# Patient Record
Sex: Male | Born: 2000 | Race: Black or African American | Hispanic: No | Marital: Single | Smoking: Never smoker
Health system: Southern US, Community
[De-identification: ages and names within clinical notes are randomized; demographics above are authoritative.]

## PROBLEM LIST (undated history)

## (undated) DIAGNOSIS — R51 Headache: Secondary | ICD-10-CM

## (undated) DIAGNOSIS — I4891 Unspecified atrial fibrillation: Secondary | ICD-10-CM

## (undated) HISTORY — DX: Headache: R51

---
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
## 2021-01-20 ENCOUNTER — Other Ambulatory Visit: Payer: Medicaid Other

## 2021-01-20 ENCOUNTER — Other Ambulatory Visit: Payer: Self-pay

## 2021-01-20 DIAGNOSIS — Z20822 Contact with and (suspected) exposure to covid-19: Secondary | ICD-10-CM

## 2021-01-22 LAB — NOVEL CORONAVIRUS, NAA: SARS-CoV-2, NAA: NOT DETECTED

## 2021-01-22 LAB — SARS-COV-2, NAA 2 DAY TAT

## 2022-01-14 ENCOUNTER — Telehealth: Payer: Self-pay

## 2022-01-14 NOTE — Telephone Encounter (Signed)
Notes scanned to referral 

## 2022-01-27 ENCOUNTER — Ambulatory Visit (INDEPENDENT_AMBULATORY_CARE_PROVIDER_SITE_OTHER): Payer: Medicaid Other | Admitting: Cardiovascular Disease

## 2022-01-27 ENCOUNTER — Encounter: Payer: Self-pay | Admitting: Cardiovascular Disease

## 2022-01-27 ENCOUNTER — Other Ambulatory Visit: Payer: Self-pay

## 2022-01-27 VITALS — BP 122/84 | HR 67 | Ht 69.0 in | Wt 204.6 lb

## 2022-01-27 DIAGNOSIS — I480 Paroxysmal atrial fibrillation: Secondary | ICD-10-CM

## 2022-01-27 DIAGNOSIS — I422 Other hypertrophic cardiomyopathy: Secondary | ICD-10-CM

## 2022-01-27 DIAGNOSIS — Z01812 Encounter for preprocedural laboratory examination: Secondary | ICD-10-CM

## 2022-01-27 NOTE — Progress Notes (Signed)
Cardiology Office Note:    Date:  01/27/2022   ID:  James Moses, DOB [birth year], MRN [id number]  PCP:  Silvano Rusk, MD   [facility] Providers Cardiologist:  Thurmon Fair, MD     Referring MD: Dennison Mascot, MD   Chief Complaint  Patient presents with   Atrial Fibrillation    History of Present Illness:    James Moses is a 21 y.o. male with a hx of 2 episodes of paroxysmal atrial fibrillation and a questionable diagnosis of hypertrophic cardiomyopathy who has been previously followed in the [facility].  He is now 21 years old and is moving to an adult cardiology setting.  He had the initial episode of atrial fibrillation in September 2020 when he was 21 years old.  This happened while he was sitting down and laughing while at work at [employer].  In October 2020 he underwent an EP study that did not identify accessory pathways or AV node reentry or any other triggering arrhythmia.  In December 2020 had a recurrence episode of atrial fibrillation at rest, for which he required electrical cardioversion.  He has undergone a couple of cardiac MRI studies that have shown mild focal left ventricular thickening at the level of the inferoseptal and posterolateral walls to a thickness of about 13 mm (most recent MRI performed in January 2022, adenosine stress MRI).  He has not had any new episodes of atrial fibrillation since his last appointment and denies any palpitations.  He does not have chest pain or dyspnea at rest or with activity.  He has not had syncope however.  Denies lower extremity edema, focal neurological complaints.  There is no family history of hypertrophic cardiomyopathy or unexpected sudden death or unexplained arrhythmia.  His biological mother had lupus and end-stage renal disease and congestive heart failure.  His maternal grandmother died of cancer, there is little knowledge about the maternal grandfather's history.   The paternal grandfather is alive and healthy.  The maternal mother died of a probable drug overdose.  Past Medical History:  Diagnosis Date   Headache(784.0)     History reviewed. No pertinent surgical history.  Current Medications: Current Meds  Medication Sig   ibuprofen (ADVIL) 800 MG tablet Take 1 tablet (800 mg total) by mouth 3 (three) times daily.     Allergies:   Patient has no known allergies.   Social History   Socioeconomic History   Marital status: Single    Spouse name: Not on file   Number of children: Not on file   Years of education: Not on file   Highest education level: Not on file  Occupational History   Not on file  Tobacco Use   Smoking status: Never   Smokeless tobacco: Never  Vaping Use   Vaping Use: Never used  Substance and Sexual Activity   Alcohol use: Not on file   Drug use: Not Currently   Sexual activity: Not Currently  Other Topics Concern   Not on file  Social History Narrative   Not on file   Social Determinants of Health   Financial Resource Strain: Not on file  Food Insecurity: Not on file  Transportation Needs: Not on file  Physical Activity: Not on file  Stress: Not on file  Social Connections: Not on file     Family History: The patient's family history includes Cancer in his maternal grandmother; Headache in his father; Heart attack in his maternal  grandfather; Stroke in his paternal grandmother.  ROS:   Please see the history of present illness.    All other systems reviewed and are negative.  EKGs/Labs/Other Studies Reviewed:    The following studies were reviewed today: Notes, echocardiograms, MRI reports.  Echo 08/31/2019   1. Normal biventricular size and systolic function.   FINDINGS   Segmental Anatomy, Cardiac Position and Situs:  (Solitus D Ventricular Looping S). The heart position is within the left  hemithorax (levocardia). The cardiac apex is oriented leftward. The aorta  is to the right of the  pulmonary artery. The visceral situs was not well  visualized.   Systemic Veins:  A superior vena cava was not well visualized. The inferior vena cava is  right-sided and inserts into the right atrium normally.   Pulmonary Veins:  One right sided pulmonary vein seen to return normally to left atrium.   Atria:   Atrial septum not ideally visualized but no evidence of atrial level shunt  noted. The right atrium is normal in size. The left atrium is normal in  size.    Tricuspid Valve:  The tricuspid valve was normal. There is trivial (physiologic) tricuspid  valve regurgitation. There is no evidence of tricuspid valve stenosis. The  tricuspid regurgitant jet, as recorded, is inadequate for the purpose of  estimating right ventricular  systolic pressure.   Right Ventricle:  There is normal right ventricular size and qualitatively normal systolic  shortening.   Mitral Valve:  The mitral valve was normal. There is no evidence of mitral valve  stenosis. The papillary muscle configuration appears normal. There is no  mitral valve regurgitation.   Left Ventricle:   There is normal left ventricular size and qualitatively normal systolic  shortening. With improved filling, left ventricular wall measurements are  now within upper limits of normal for body size.   VSD:  There is no ventricular septal defect seen.   RVOT:  There is no right ventricular outflow tract obstruction.   Pulmonary Valve:  The pulmonary valve is structurally normal without stenosis. There is no  pulmonary valve stenosis. There is trivial pulmonary valve regurgitation.   Pulmonary Arteries:   The branch pulmonary arteries appear normal. The main pulmonary artery is  normal.   LVOT:  There is no left ventricular outflow tract obstruction.   Aortic Valve:  The aortic valve is normal. There is no aortic valve stenosis. There is no  aortic valve regurgitation.   Coronary Arteries:  The coronary  arteries were not well visualized.   Aorta:  The ascending aorta, transverse arch and descending aorta appear  unobstructed. Arch sidedness not well delineated on the study.   Pericardium:  There is no pericardial effusion.   Spectral Doppler and color Doppler were used to assess outflow tracts,  atrioventricular valves and semilunar valves.   LV M-mode:                Z-score LV Systolic Function:  IVS d:         1.11 cm    1.11    LV FS (M-mode):       35.9 %  IVS s:         1.18 cm    -0.65   LV EF (Cube):         74 %  LVID d:        3.79 cm    -4.26   LV EF (Teich):  66.2 %  LVID s:        2.43 cm    -3.02   LV SV:                40.8 ml  LVPW d:        1.18 cm    1.59    LV SI:                20.4 ml/m  LVPW s:        1.61 cm    0.06    LV Vol d:             61.6 ml  LV mass        177.1 g            LV Vol s:             20.8 ml  LV mass index: 86.77 g/m   EKG:  EKG is ordered today.  The ekg ordered today demonstrates sinus rhythm, T wave inversion in leads V5 V6, lead III and biphasic T wave in lead II and aVF.  Very similar to ECG from 11/26/2019  Recent Labs: No results found for requested labs within last 8760 hours.  Recent Lipid Panel No results found for: CHOL, TRIG, HDL, CHOLHDL, VLDL, LDLCALC, LDLDIRECT   Risk Assessment/Calculations:    CHA2DS2-VASc Score = 0   This indicates a 0.2% annual risk of stroke. The patient's score is based upon: CHF History: 0 HTN History: 0 Diabetes History: 0 Stroke History: 0 Vascular Disease History: 0 Age Score: 0 Gender Score: 0           Physical Exam:    VS:  BP 122/84    Pulse 67    Ht 5\' 9"  (1.753 m)    Wt 204 lb 9.6 oz (92.8 kg)    SpO2 98%    BMI 30.21 kg/m     Wt Readings from Last 3 Encounters:  01/27/22 204 lb 9.6 oz (92.8 kg) (94 %, Z= 1.55)*  11/26/19 191 lb 2.2 oz (86.7 kg) (93 %, Z= 1.48)*  08/30/19 184 lb 11.9 oz (83.8 kg) (91 %, Z= 1.37)*   * Growth percentiles are based on CDC (Boys,  2-20 Years) data.     GEN: Borderline obese, fit appearing, well nourished, well developed in no acute distress HEENT: Normal NECK: No JVD; No carotid bruits LYMPHATICS: No lymphadenopathy CARDIAC: RRR, no murmurs, rubs, gallops.  Unable to elicit a murmur with a Valsalva maneuver. RESPIRATORY:  Clear to auscultation without rales, wheezing or rhonchi  ABDOMEN: Soft, non-tender, non-distended MUSCULOSKELETAL:  No edema; No deformity  SKIN: Warm and dry NEUROLOGIC:  Alert and oriented x 3 PSYCHIATRIC:  Normal affect   ASSESSMENT:    1. Paroxysmal atrial fibrillation (HCC)   2. Hypertrophic cardiomyopathy ([place])   3. Pre-procedure lab exam    PLAN:    In order of problems listed above:  AFib: Infrequent events without clear trigger.  None in the last year.  Questionable underlying structural heart disease (hypertrophic cardiomyopathy).  Low embolic risk.  Not on anticoagulation or antiarrhythmic medications.  If another event occurs, would recommend a trial of flecainide "pill in pocket" approach.  He has a smart watch, but it is not capable of ECG monitoring.  Suggest upgrading to a generation for a higher apple watch or buying a cardia device for closer monitoring.  Avoid alcohol.  Okay to exercise, but prefer a aerobic rather than  anaerobic intense exercise. LV thickening: Questionable whether or not there is enough evidence to justify a diagnosis of hypertrophic cardiomyopathy.  As outlined in his pediatric cardiologist notes recommend a follow-up MRI.  Even if he has hypertrophic cardiomyopathy has no evidence of LVOT obstruction or any high risk features such as history of VT/syncope/family history of unexpected sudden death/excessive hypertrophy/severe scarring on LGE.           Medication Adjustments/Labs and Tests Ordered: Current medicines are reviewed at length with the patient today.  Concerns regarding medicines are outlined above.  Orders Placed This Encounter   Procedures   MR CARDIAC MORPHOLOGY W WO CONTRAST   Hemoglobin and hematocrit, blood   Basic Metabolic Panel (BMET)   EKG 12-Lead   No orders of the defined types were placed in this encounter.   Patient Instructions  Medication Instructions:  No changes *If you need a refill on your cardiac medications before your next appointment, please call your pharmacy*  Follow-Up: At [facility], you and your health needs are our priority.  As part of our continuing mission to provide you with exceptional heart care, we have created designated Provider Care Teams.  These Care Teams include your primary Cardiologist (physician) and Advanced Practice Providers (APPs -  Physician Assistants and Nurse Practitioners) who all work together to provide you with the care you need, when you need it.  We recommend signing up for the patient portal called "MyChart".  Sign up information is provided on this After Visit Summary.  MyChart is used to connect with patients for Virtual Visits (Telemedicine).  Patients are able to view lab/test results, encounter notes, upcoming appointments, etc.  Non-urgent messages can be sent to your provider as well.   To learn more about what you can do with MyChart, go to [email or web address].    Your next appointment:   12 month(s)  The format for your next appointment:   In Person  Provider:   Thurmon Fair, MD  Other Instructions   You are scheduled for Cardiac MRI on ______________. Please arrive at the [facility] main entrance of [facility] at ________________ (30-45 minutes prior to test start time). ?  [facility] [address] [place], [place] [address] [phone]  Please take advantage of the free valet parking available at the MAIN entrance (A entrance). Proceed to the [facility] Radiology Department (First Floor). ? Magnetic resonance imaging (MRI) is a painless test that produces images of the inside of the body  without using Xrays.  During an MRI, strong magnets and radio waves work together in a Data processing manager to form detailed images.   MRI images may provide more details about a medical condition than X-rays, CT scans, and ultrasounds can provide.  You may be given earphones to listen for instructions.  You may eat a light breakfast and take medications as ordered with the exception of HCTZ (fluid pill, other). Please avoid stimulants for 12 hr prior to test. (Ie. Caffeine, nicotine, chocolate, or antihistamine medications)  If a contrast material will be used, an IV will be inserted into one of your veins. Contrast material will be injected into your IV. It will leave your body through your urine within a day. You may be told to drink plenty of fluids to help flush the contrast material out of your system.  You will be asked to remove all metal, including: Watch, jewelry, and other metal objects including hearing aids, hair pieces and dentures. Also  wearable glucose monitoring systems (ie. Freestyle Libre and Omnipods) (Braces and fillings normally are not a problem.)   TEST WILL TAKE APPROXIMATELY 1 HOUR  PLEASE NOTIFY SCHEDULING AT LEAST 24 HOURS IN ADVANCE IF YOU ARE UNABLE TO KEEP YOUR APPOINTMENT. [phone]  Please call Marchia Bond, cardiac imaging nurse navigator with any questions/concerns. Marchia Bond RN Navigator Cardiac Imaging Gordy Clement RN Navigator Cardiac Imaging [facility] [phone] Office      Signed, Sanda Klein, MD  01/27/2022 6:25 PM    [place]

## 2022-01-27 NOTE — Patient Instructions (Addendum)
Medication Instructions:  No changes *If you need a refill on your cardiac medications before your next appointment, please call your pharmacy*  Follow-Up: At [facility], you and your health needs are our priority.  As part of our continuing mission to provide you with exceptional heart care, we have created designated Provider Care Teams.  These Care Teams include your primary Cardiologist (physician) and Advanced Practice Providers (APPs -  Physician Assistants and Nurse Practitioners) who all work together to provide you with the care you need, when you need it.  We recommend signing up for the patient portal called "MyChart".  Sign up information is provided on this After Visit Summary.  MyChart is used to connect with patients for Virtual Visits (Telemedicine).  Patients are able to view lab/test results, encounter notes, upcoming appointments, etc.  Non-urgent messages can be sent to your provider as well.   To learn more about what you can do with MyChart, go to [email or web address].    Your next appointment:   12 month(s)  The format for your next appointment:   In Person  Provider:   Thurmon Fair, MD  Other Instructions   You are scheduled for Cardiac MRI on ______________. Please arrive at the [facility] main entrance of  Medical Center at ________________ (30-45 minutes prior to test start time). ?  [facility] [address] [place], [place] [address] [phone]  Please take advantage of the free valet parking available at the MAIN entrance (A entrance). Proceed to the [facility] Radiology Department (First Floor). ? Magnetic resonance imaging (MRI) is a painless test that produces images of the inside of the body without using Xrays.  During an MRI, strong magnets and radio waves work together in a Data processing manager to form detailed images.   MRI images may provide more details about a medical condition than X-rays, CT scans, and ultrasounds  can provide.  You may be given earphones to listen for instructions.  You may eat a light breakfast and take medications as ordered with the exception of HCTZ (fluid pill, other). Please avoid stimulants for 12 hr prior to test. (Ie. Caffeine, nicotine, chocolate, or antihistamine medications)  If a contrast material will be used, an IV will be inserted into one of your veins. Contrast material will be injected into your IV. It will leave your body through your urine within a day. You may be told to drink plenty of fluids to help flush the contrast material out of your system.  You will be asked to remove all metal, including: Watch, jewelry, and other metal objects including hearing aids, hair pieces and dentures. Also wearable glucose monitoring systems (ie. Freestyle Libre and Omnipods) (Braces and fillings normally are not a problem.)   TEST WILL TAKE APPROXIMATELY 1 HOUR  PLEASE NOTIFY SCHEDULING AT LEAST 24 HOURS IN ADVANCE IF YOU ARE UNABLE TO KEEP YOUR APPOINTMENT. [phone]  Please call Rockwell Alexandria, cardiac imaging nurse navigator with any questions/concerns. Rockwell Alexandria RN Navigator Cardiac Imaging Larey Brick RN Navigator Cardiac Imaging [facility] [phone] Office

## 2022-01-28 LAB — HEMOGLOBIN AND HEMATOCRIT, BLOOD
Hematocrit: 46.9 % (ref 37.5–51.0)
Hemoglobin: 15.5 g/dL (ref 13.0–17.7)

## 2022-01-28 LAB — BASIC METABOLIC PANEL
BUN/Creatinine Ratio: 9 (ref 9–20)
BUN: 8 mg/dL (ref 6–20)
CO2: 26 mmol/L (ref 20–29)
Calcium: 9.9 mg/dL (ref 8.7–10.2)
Chloride: 99 mmol/L (ref 96–106)
Creatinine, Ser: 0.93 mg/dL (ref 0.76–1.27)
Glucose: 78 mg/dL (ref 70–99)
Potassium: 4.2 mmol/L (ref 3.5–5.2)
Sodium: 138 mmol/L (ref 134–144)
eGFR: 121 mL/min/{1.73_m2} (ref >59)

## 2022-02-01 ENCOUNTER — Encounter: Payer: Self-pay | Admitting: *Deleted

## 2022-02-26 ENCOUNTER — Telehealth (HOSPITAL_COMMUNITY): Payer: Self-pay | Admitting: Emergency Medicine

## 2022-02-26 NOTE — Telephone Encounter (Signed)
Reaching out to patient to offer assistance regarding upcoming cardiac imaging study; pt verbalizes understanding of appt date/time, parking situation and where to check in, pre-test NPO status and medications ordered, and verified current allergies; name and call back number provided for further questions should they arise ?Marchia Bond RN Navigator Cardiac Imaging ?[facility] ?[phone] office ?[phone] cell ? ?Denies implants ?Denies claustro ?Denies iv issues ?Arrival 1130 ?

## 2022-03-01 ENCOUNTER — Ambulatory Visit (HOSPITAL_COMMUNITY)
Admission: RE | Admit: 2022-03-01 | Discharge: 2022-03-01 | Disposition: A | Discharge status: Home / Self Care | Payer: Medicaid Other | Source: Ambulatory Visit | Attending: Cardiovascular Disease | Admitting: Cardiovascular Disease

## 2022-03-01 ENCOUNTER — Other Ambulatory Visit: Payer: Self-pay

## 2022-03-01 DIAGNOSIS — I422 Other hypertrophic cardiomyopathy: Secondary | ICD-10-CM | POA: Diagnosis present

## 2022-03-01 MED ORDER — GADOBUTROL 1 MMOL/ML IV SOLN
10.0000 mL | Freq: Once | INTRAVENOUS | Status: AC | PRN
  Administered 2022-03-01: 10 mL via INTRAVENOUS

## 2022-03-11 ENCOUNTER — Encounter: Payer: Self-pay | Admitting: *Deleted

## 2023-03-07 ENCOUNTER — Ambulatory Visit: Payer: Medicaid Other | Admitting: Cardiovascular Disease

## 2023-04-12 ENCOUNTER — Ambulatory Visit: Payer: Medicaid Other | Attending: Cardiovascular Disease | Admitting: Cardiovascular Disease

## 2023-04-12 ENCOUNTER — Encounter: Payer: Self-pay | Admitting: Cardiovascular Disease

## 2023-04-12 VITALS — BP 110/70 | HR 69 | Ht 70.0 in | Wt 223.8 lb

## 2023-04-12 DIAGNOSIS — I422 Other hypertrophic cardiomyopathy: Secondary | ICD-10-CM | POA: Diagnosis not present

## 2023-04-12 DIAGNOSIS — I480 Paroxysmal atrial fibrillation: Secondary | ICD-10-CM | POA: Diagnosis not present

## 2023-04-12 NOTE — Patient Instructions (Signed)
Medication Instructions:  No changes *If you need a refill on your cardiac medications before your next appointment, please call your pharmacy*  Testing/Procedures: Your physician has requested that you have an echocardiogram. Echocardiography is a painless test that uses sound waves to create images of your heart. It provides your doctor with information about the size and shape of your heart and how well your heart's chambers and valves are working. This procedure takes approximately one hour. There are no restrictions for this procedure. Please do NOT wear cologne, perfume, aftershave, or lotions (deodorant is allowed). Please arrive 15 minutes prior to your appointment time.    Follow-Up: At [facility], you and your health needs are our priority.  As part of our continuing mission to provide you with exceptional heart care, we have created designated Provider Care Teams.  These Care Teams include your primary Cardiologist (physician) and Advanced Practice Providers (APPs -  Physician Assistants and Nurse Practitioners) who all work together to provide you with the care you need, when you need it.  We recommend signing up for the patient portal called "MyChart".  Sign up information is provided on this After Visit Summary.  MyChart is used to connect with patients for Virtual Visits (Telemedicine).  Patients are able to view lab/test results, encounter notes, upcoming appointments, etc.  Non-urgent messages can be sent to your provider as well.   To learn more about what you can do with MyChart, go to [email or web address].    Your next appointment:   1 year(s)  Provider:   Mihai Croitoru, MD      

## 2023-04-12 NOTE — Progress Notes (Signed)
Cardiology Office Note:    Date:  04/12/2023   ID:  James Moses, DOB [birth year], MRN [id number]  PCP:  Pcp, No   [facility] Providers Cardiologist:  Thurmon Fair, MD     Referring MD: No ref. provider found   No chief complaint on file.   History of Present Illness:    James Moses is a 22 y.o. male with a hx of 2 episodes of paroxysmal atrial fibrillation and a questionable diagnosis of hypertrophic cardiomyopathy who has been previously followed in the [facility].    In the last 12 months he has not had any problems with palpitations, dizziness,  exertional dyspnea or angina.  Denies edema, orthopnea, PND, focal neurological events or claudication.  He has never experienced syncope.  He feels well.  He can exercise at the gym without any complaints.    He has had 2 previous episodes of atrial fibrillation in September 2020 and in December 2020 both of which occurred at rest.  The second event required electrical cardioversion.  He had an EP study in October 2020 that did not identify an accessory pathway, AVNRT or other triggering arrhythmia.  We repeated his cardiac MRI in March 2023.  This shows relatively mild LVH (posterior wall 1 12 mm, septum 13 mm) and does not show any evidence of gadolinium enhancement/scar.  He does not have LV outflow tract obstruction.  There is no family history of hypertrophic cardiomyopathy or unexpected sudden death or unexplained arrhythmia.  His biological mother had lupus and end-stage renal disease and congestive heart failure.  His maternal grandmother died of cancer, there is little knowledge about the maternal grandfather's history.  The paternal grandfather is alive and healthy.  The maternal grandmother died of a probable drug overdose.  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  Past Medical History:  Diagnosis Date   Headache(784.0)     No past surgical history on  file.  Current Medications: No outpatient medications have been marked as taking for the 04/12/23 encounter (Office Visit) with Thurmon Fair, MD.     Allergies:   Patient has no known allergies.   Social History   Socioeconomic History   Marital status: Single    Spouse name: Not on file   Number of children: Not on file   Years of education: Not on file   Highest education level: Not on file  Occupational History   Not on file  Tobacco Use   Smoking status: Never   Smokeless tobacco: Never  Vaping Use   Vaping Use: Never used  Substance and Sexual Activity   Alcohol use: Not on file   Drug use: Not Currently   Sexual activity: Not Currently  Other Topics Concern   Not on file  Social History Narrative   Not on file   Social Determinants of Health   Financial Resource Strain: Not on file  Food Insecurity: Not on file  Transportation Needs: Not on file  Physical Activity: Not on file  Stress: Not on file  Social Connections: Not on file     Family History: The patient's family history includes Cancer in his maternal grandmother; Headache in his father; Heart attack in his maternal grandfather; Stroke in his paternal grandmother.  ROS:   Please see the history of present illness.    All other systems reviewed and are negative.  EKGs/Labs/Other Studies Reviewed:    The following studies were  reviewed today: Cardiac MRI 03/01/2022  1. Mild LV hypertrophy measuring up to 13mm in septum (12mm in posterior wall), not meeting criteria for hypertrophic cardiomyopathy (<78mm)   2.  No late gadolinium enhancement to suggest myocardial scar   3.  Normal LV size and systolic function (EF 70%)   4.  Normal RV size and systolic function (EF 64%)  Echo 08/31/2019   1. Normal biventricular size and systolic function.   FINDINGS   Segmental Anatomy, Cardiac Position and Situs:  (Solitus D Ventricular Looping S). The heart position is within the left  hemithorax  (levocardia). The cardiac apex is oriented leftward. The aorta  is to the right of the pulmonary artery. The visceral situs was not well  visualized.   Systemic Veins:  A superior vena cava was not well visualized. The inferior vena cava is  right-sided and inserts into the right atrium normally.   Pulmonary Veins:  One right sided pulmonary vein seen to return normally to left atrium.   Atria:   Atrial septum not ideally visualized but no evidence of atrial level shunt  noted. The right atrium is normal in size. The left atrium is normal in  size.    Tricuspid Valve:  The tricuspid valve was normal. There is trivial (physiologic) tricuspid  valve regurgitation. There is no evidence of tricuspid valve stenosis. The  tricuspid regurgitant jet, as recorded, is inadequate for the purpose of  estimating right ventricular  systolic pressure.   Right Ventricle:  There is normal right ventricular size and qualitatively normal systolic  shortening.   Mitral Valve:  The mitral valve was normal. There is no evidence of mitral valve  stenosis. The papillary muscle configuration appears normal. There is no  mitral valve regurgitation.   Left Ventricle:   There is normal left ventricular size and qualitatively normal systolic  shortening. With improved filling, left ventricular wall measurements are  now within upper limits of normal for body size.   VSD:  There is no ventricular septal defect seen.   RVOT:  There is no right ventricular outflow tract obstruction.   Pulmonary Valve:  The pulmonary valve is structurally normal without stenosis. There is no  pulmonary valve stenosis. There is trivial pulmonary valve regurgitation.   Pulmonary Arteries:   The branch pulmonary arteries appear normal. The main pulmonary artery is  normal.   LVOT:  There is no left ventricular outflow tract obstruction.   Aortic Valve:  The aortic valve is normal. There is no aortic valve  stenosis. There is no  aortic valve regurgitation.   Coronary Arteries:  The coronary arteries were not well visualized.   Aorta:  The ascending aorta, transverse arch and descending aorta appear  unobstructed. Arch sidedness not well delineated on the study.   Pericardium:  There is no pericardial effusion.   Spectral Doppler and color Doppler were used to assess outflow tracts,  atrioventricular valves and semilunar valves.   LV M-mode:                Z-score LV Systolic Function:  IVS d:         1.11 cm    1.11    LV FS (M-mode):       35.9 %  IVS s:         1.18 cm    -0.65   LV EF (Cube):         74 %  LVID d:  3.79 cm    -4.26   LV EF (Teich):        66.2 %  LVID s:        2.43 cm    -3.02   LV SV:                40.8 ml  LVPW d:        1.18 cm    1.59    LV SI:                20.4 ml/m  LVPW s:        1.61 cm    0.06    LV Vol d:             61.6 ml  LV mass        177.1 g            LV Vol s:             20.8 ml  LV mass index: 86.77 g/m   EKG:  EKG is ordered today.  It shows normal sinus rhythm, T wave inversion leads V4-V5-V6 as well as lead III.  QTc 396 ms.  Very similar to previous tracings.  Recent Labs: No results found for requested labs within last 365 days.  Recent Lipid Panel No results found for: "CHOL", "TRIG", "HDL", "CHOLHDL", "VLDL", "LDLCALC", "LDLDIRECT"   Risk Assessment/Calculations:                 Physical Exam:    VS:  BP 110/70 (BP Location: Left Arm, Patient Position: Sitting, Cuff Size: Large)   Pulse 69   Ht 5\' 10"  (1.778 m)   Wt 223 lb 12.8 oz (101.5 kg)   SpO2 98%   BMI 32.11 kg/m     Wt Readings from Last 3 Encounters:  04/12/23 223 lb 12.8 oz (101.5 kg)  01/27/22 204 lb 9.6 oz (92.8 kg) (94 %, Z= 1.55)*  11/26/19 191 lb 2.2 oz (86.7 kg) (93 %, Z= 1.48)*   * Growth percentiles are based on CDC (Boys, 2-20 Years) data.      General: Alert, oriented x3, no distress, muscular, but also borderline obese Head: no  evidence of trauma, PERRL, EOMI, no exophtalmos or lid lag, no myxedema, no xanthelasma; normal ears, nose and oropharynx Neck: normal jugular venous pulsations and no hepatojugular reflux; brisk carotid pulses without delay and no carotid bruits Chest: clear to auscultation, no signs of consolidation by percussion or palpation, normal fremitus, symmetrical and full respiratory excursions Cardiovascular: normal position and quality of the apical impulse, regular rhythm, normal first and second heart sounds, no murmurs, rubs or gallops Abdomen: no tenderness or distention, no masses by palpation, no abnormal pulsatility or arterial bruits, normal bowel sounds, no hepatosplenomegaly Extremities: no clubbing, cyanosis or edema; 2+ radial, ulnar and brachial pulses bilaterally; 2+ right femoral, posterior tibial and dorsalis pedis pulses; 2+ left femoral, posterior tibial and dorsalis pedis pulses; no subclavian or femoral bruits Neurological: grossly nonfocal Psych: Normal mood and affect   ASSESSMENT:    1. Paroxysmal atrial fibrillation   2. Hypertrophic cardiomyopathy    PLAN:    In order of problems listed above:  AFib: Very infrequent events, none in the last 3 years, minimally symptomatic, without clear trigger but with underlying substrate of mild hypertrophic cardiomyopathy.  If another event occurs, would recommend a trial of flecainide "pill in pocket" approach.  Anticoagulation is not indicated at this time.  He  has a smart watch, but it is not capable of ECG monitoring.  Suggest upgrading to a generation for a higher apple watch or buying a cardia device for closer monitoring.  Avoid alcohol.  Okay to exercise, but prefer a aerobic rather than anaerobic intense exercise. LV thickening: There is mild left ventricular hypertrophy does not meet threshold to diagnose hypertrophic cardiomyopathy, but coupled with the history of atrial fibrillation and a mild ECG changes suggest that he does  have a mild cardiomyopathy.  Even if he has hypertrophic cardiomyopathy has no evidence of LVOT obstruction or any high risk features such as history of VT/syncope/family history of unexpected sudden death/excessive hypertrophy/severe scarring on LGE. Plan to monitor with echocardiograms every 3-5 years, cardiac MRI every 5-10 years, unless he develops new symptoms or ventricular arrhythmia.           Medication Adjustments/Labs and Tests Ordered: Current medicines are reviewed at length with the patient today.  Concerns regarding medicines are outlined above.  Orders Placed This Encounter  Procedures   EKG 12-Lead   ECHOCARDIOGRAM COMPLETE   No orders of the defined types were placed in this encounter.   Patient Instructions  Medication Instructions:  No changes *If you need a refill on your cardiac medications before your next appointment, please call your pharmacy*  Testing/Procedures: Your physician has requested that you have an echocardiogram. Echocardiography is a painless test that uses sound waves to create images of your heart. It provides your doctor with information about the size and shape of your heart and how well your heart's chambers and valves are working. This procedure takes approximately one hour. There are no restrictions for this procedure. Please do NOT wear cologne, perfume, aftershave, or lotions (deodorant is allowed). Please arrive 15 minutes prior to your appointment time.    Follow-Up: At [facility], you and your health needs are our priority.  As part of our continuing mission to provide you with exceptional heart care, we have created designated Provider Care Teams.  These Care Teams include your primary Cardiologist (physician) and Advanced Practice Providers (APPs -  Physician Assistants and Nurse Practitioners) who all work together to provide you with the care you need, when you need it.  We recommend signing up for the patient portal  called "MyChart".  Sign up information is provided on this After Visit Summary.  MyChart is used to connect with patients for Virtual Visits (Telemedicine).  Patients are able to view lab/test results, encounter notes, upcoming appointments, etc.  Non-urgent messages can be sent to your provider as well.   To learn more about what you can do with MyChart, go to [email or web address].    Your next appointment:   1 year(s)  Provider:   Thurmon Fair, MD        Signed, Thurmon Fair, MD  04/12/2023 12:29 PM     [facility]

## 2023-05-16 ENCOUNTER — Ambulatory Visit (HOSPITAL_COMMUNITY): Payer: Medicaid Other | Attending: Cardiovascular Disease

## 2023-05-16 DIAGNOSIS — I422 Other hypertrophic cardiomyopathy: Secondary | ICD-10-CM | POA: Insufficient documentation

## 2023-05-16 LAB — ECHOCARDIOGRAM COMPLETE
Area-P 1/2: 3.45 cm2
S' Lateral: 2.3 cm

## 2023-05-17 IMAGING — MR MR CARD MORPHOLOGY WO/W CM
45 of 48 series · 45 of 48 positions shown · IV contrast (Contrast agent)
Comparison: none

CLINICAL DATA: HCM evaluation

EXAM:
CARDIAC MRI
TECHNIQUE: The patient was scanned on a 1.5 Tesla Siemens magnet. A dedicated
cardiac coil was used. Functional imaging was done using Fiesta
sequences. [DATE], and 4 chamber views were done to assess for RWMA's.
Modified Kareem rule using a short axis stack was used to
calculate an ejection fraction on a dedicated work station using
Circle software. The patient received 10 cc of Gadavist. After 10
minutes inversion recovery sequences were used to assess for
infiltration and scar tissue.
CONTRAST:  10 cc  of Gadavist

[Series 4: [id number] · axial · 8.0mm · 1.41mm/px · 1 of 16 slices shown]
[im 1/16]
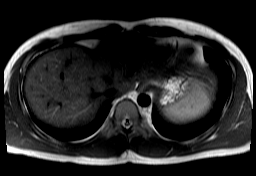

[Series 8: bSSFP · oblique · 8.0mm · 1.61mm/px · 1 of 25 slices shown (1 of 22)]
[im 1/25]
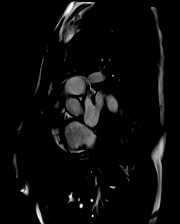

[Series 9: bSSFP · oblique · 8.0mm · 1.61mm/px · 1 of 25 slices shown (2 of 22)]
[im 1/25]
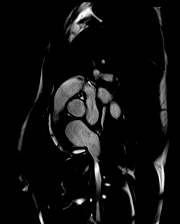

[Series 10: bSSFP · oblique · 8.0mm · 1.61mm/px · 1 of 25 slices shown (3 of 22)]
[im 1/25]
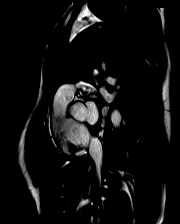

[Series 11: bSSFP · oblique · 8.0mm · 1.61mm/px · 1 of 25 slices shown (4 of 22)]
[im 1/25]
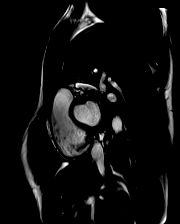

[Series 12: bSSFP · oblique · 8.0mm · 1.61mm/px · 1 of 25 slices shown (5 of 22)]
[im 1/25]
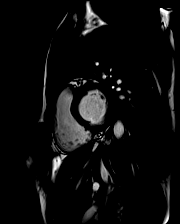

[Series 13: bSSFP · oblique · 8.0mm · 1.61mm/px · 1 of 25 slices shown (6 of 22)]
[im 1/25]
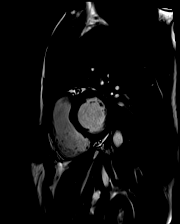

[Series 14: bSSFP · oblique · 8.0mm · 1.61mm/px · 1 of 25 slices shown (7 of 22)]
[im 1/25]
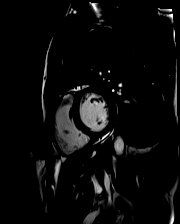

[Series 15: bSSFP · oblique · 8.0mm · 1.61mm/px · 1 of 25 slices shown (8 of 22)]
[im 1/25]
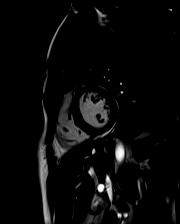

[Series 16: bSSFP · oblique · 8.0mm · 1.61mm/px · 1 of 25 slices shown (9 of 22)]
[im 1/25]
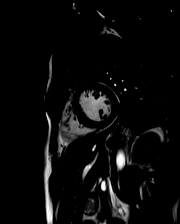

[Series 17: bSSFP · oblique · 8.0mm · 1.61mm/px · 1 of 25 slices shown (10 of 22)]
[im 1/25]
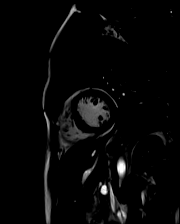

[Series 18: bSSFP · oblique · 8.0mm · 1.61mm/px · 1 of 25 slices shown (11 of 22)]
[im 1/25]
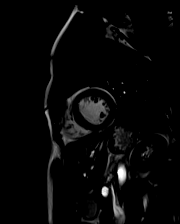

[Series 19: bSSFP · oblique · 8.0mm · 1.61mm/px · 1 of 25 slices shown (12 of 22)]
[im 1/25]
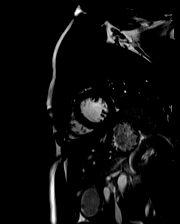

[Series 20: bSSFP · oblique · 8.0mm · 1.61mm/px · 1 of 25 slices shown (13 of 22)]
[im 1/25]
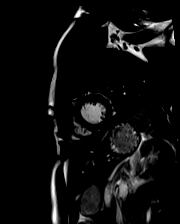

[Series 21: bSSFP · oblique · 8.0mm · 1.61mm/px · 1 of 25 slices shown (14 of 22)]
[im 1/25]
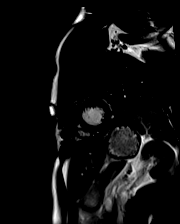

[Series 22: bSSFP · oblique · 8.0mm · 1.61mm/px · 1 of 25 slices shown (15 of 22)]
[im 1/25]
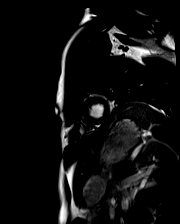

[Series 23: bSSFP · oblique · 8.0mm · 1.61mm/px · 1 of 25 slices shown (16 of 22)]
[im 1/25]
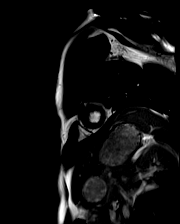

[Series 24: bSSFP · oblique · 8.0mm · 1.61mm/px · 1 of 25 slices shown (17 of 22)]
[im 1/25]
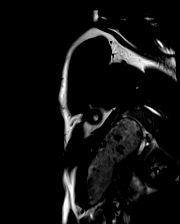

[Series 25: bSSFP · oblique · 8.0mm · 1.61mm/px · 1 of 25 slices shown (18 of 22)]
[im 1/25]
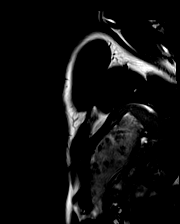

[Series 26: bSSFP · oblique · 8.0mm · 1.61mm/px · 1 of 25 slices shown (19 of 22)]
[im 1/25]
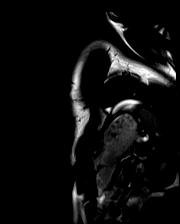

[Series 27: bSSFP · oblique · 6.0mm · 1.41mm/px · 1 of 25 slices shown (20 of 22)]
[im 1/25]
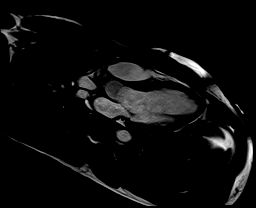

[Series 28: bSSFP · oblique · 6.0mm · 1.41mm/px · 1 of 25 slices shown (21 of 22)]
[im 1/25]
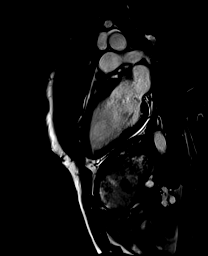

[Series 29: bSSFP · oblique · 6.0mm · 1.41mm/px · 1 of 25 slices shown (22 of 22)]
[im 1/25]
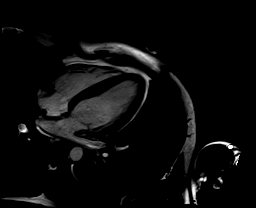

[Series 30: cine_trufi_short axis_cs_2_shot · oblique · 8.0mm · 1.48mm/px · 1 of 25 slices shown (1 of 22)]
[im 1/25]
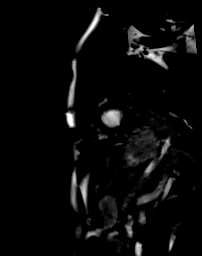

[Series 30: cine_trufi_short axis_cs_2_shot · oblique · 8.0mm · 1.48mm/px · 1 of 25 slices shown (2 of 22)]
[im 1/25]
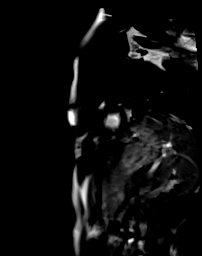

[Series 30: cine_trufi_short axis_cs_2_shot · oblique · 8.0mm · 1.48mm/px · 1 of 25 slices shown (3 of 22)]
[im 1/25]
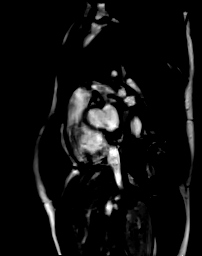

[Series 30: cine_trufi_short axis_cs_2_shot · oblique · 8.0mm · 1.48mm/px · 1 of 25 slices shown (4 of 22)]
[im 1/25]
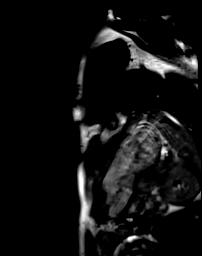

[Series 30: cine_trufi_short axis_cs_2_shot · oblique · 8.0mm · 1.48mm/px · 1 of 25 slices shown (5 of 22)]
[im 1/25]
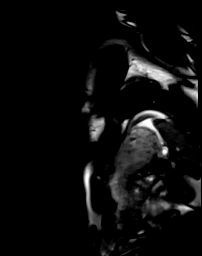

[Series 30: cine_trufi_short axis_cs_2_shot · oblique · 8.0mm · 1.48mm/px · 1 of 25 slices shown (6 of 22)]
[im 1/25]
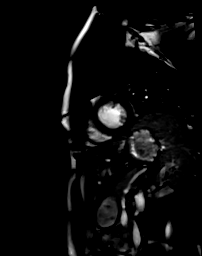

[Series 30: cine_trufi_short axis_cs_2_shot · oblique · 8.0mm · 1.48mm/px · 1 of 25 slices shown (7 of 22)]
[im 1/25]
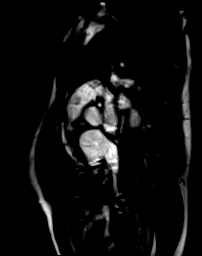

[Series 30: cine_trufi_short axis_cs_2_shot · oblique · 8.0mm · 1.48mm/px · 1 of 25 slices shown (8 of 22)]
[im 1/25]
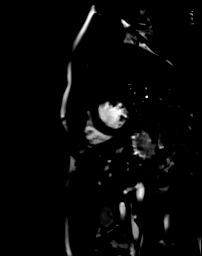

[Series 30: cine_trufi_short axis_cs_2_shot · oblique · 8.0mm · 1.48mm/px · 1 of 25 slices shown (9 of 22)]
[im 1/25]
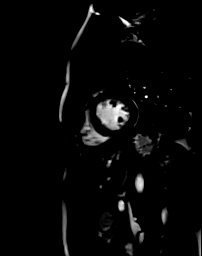

[Series 30: cine_trufi_short axis_cs_2_shot · oblique · 8.0mm · 1.48mm/px · 1 of 25 slices shown (10 of 22)]
[im 1/25]
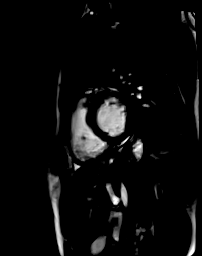

[Series 30: cine_trufi_short axis_cs_2_shot · oblique · 8.0mm · 1.48mm/px · 1 of 25 slices shown (11 of 22)]
[im 1/25]
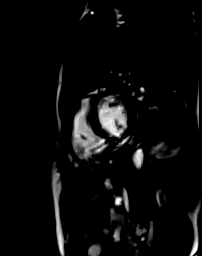

[Series 30: cine_trufi_short axis_cs_2_shot · oblique · 8.0mm · 1.48mm/px · 1 of 25 slices shown (12 of 22)]
[im 1/25]
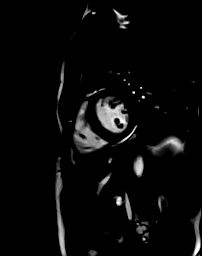

[Series 30: cine_trufi_short axis_cs_2_shot · oblique · 8.0mm · 1.48mm/px · 1 of 25 slices shown (13 of 22)]
[im 1/25]
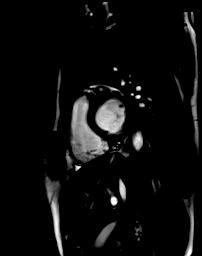

[Series 30: cine_trufi_short axis_cs_2_shot · oblique · 8.0mm · 1.48mm/px · 1 of 25 slices shown (14 of 22)]
[im 1/25]
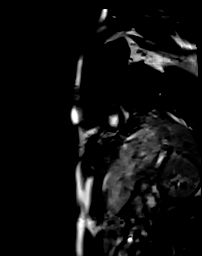

[Series 30: cine_trufi_short axis_cs_2_shot · oblique · 8.0mm · 1.48mm/px · 1 of 25 slices shown (15 of 22)]
[im 1/25]
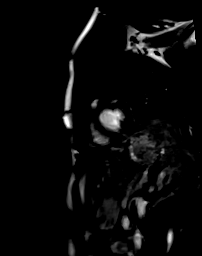

[Series 30: cine_trufi_short axis_cs_2_shot · oblique · 8.0mm · 1.48mm/px · 1 of 25 slices shown (16 of 22)]
[im 1/25]
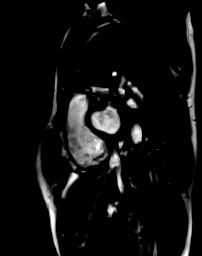

[Series 30: cine_trufi_short axis_cs_2_shot · oblique · 8.0mm · 1.48mm/px · 1 of 25 slices shown (17 of 22)]
[im 1/25]
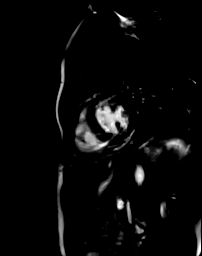

[Series 30: cine_trufi_short axis_cs_2_shot · oblique · 8.0mm · 1.48mm/px · 1 of 25 slices shown (18 of 22)]
[im 1/25]
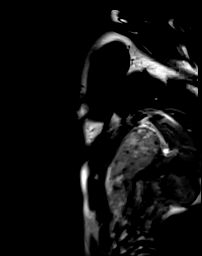

[Series 31: cine_trufi_short axis_cs_2_shot · oblique · 8.0mm · 1.48mm/px · 1 of 25 slices shown (19 of 22)]
[im 1/25]
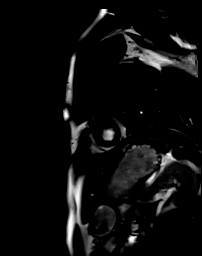

[Series 31: cine_trufi_short axis_cs_2_shot · oblique · 8.0mm · 1.48mm/px · 1 of 25 slices shown (20 of 22)]
[im 1/25]
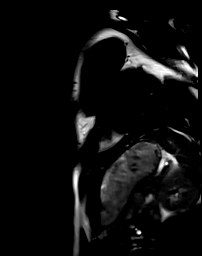

[Series 31: cine_trufi_short axis_cs_2_shot · oblique · 8.0mm · 1.48mm/px · 1 of 25 slices shown (21 of 22)]
[im 1/25]
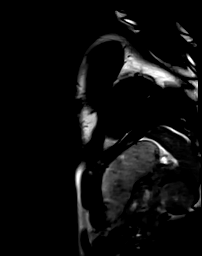

[Series 31: cine_trufi_short axis_cs_2_shot · oblique · 8.0mm · 1.48mm/px · 1 of 25 slices shown (22 of 22)]
[im 1/25]
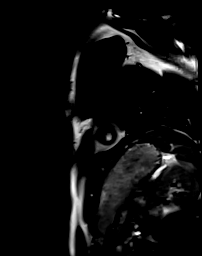

[45 of 48 positions shown; findings below may reference images not displayed]

FINDINGS: Left ventricle:

-Mild hypertrophy measuring up to 13mm in septum (12mm in posterior
wall), not meeting criteria for hypertrophic cardiomyopathy (<15mm)

-Normal size

-Normal systolic function

-Normal ECV (24%)

-No LGE

LV EF: 70% (Normal 56-78%)

Absolute volumes:

LV EDV: 171mL (Normal 77-195 mL)

LV ESV: 52mL (Normal 19-72 mL)

LV SV: 119mL (Normal 51-133 mL)

CO: 7.8L/min (Normal 2.8-8.8 L/min)

Indexed volumes:

LV EDV: 80mL/sq-m (Normal 47-92 mL/sq-m)

LV ESV: 24mL/sq-m (Normal 13-30 mL/sq-m)

LV SV: 56mL/sq-m (Normal 32-62 mL/sq-m)

CI: 3.7L/min/sq-m (Normal 1.7-4.2 L/min/sq-m)

Right ventricle: Normal size and systolic function

RV EF:  64% (Normal 47-74%)

Absolute volumes:

RV EDV: 193mL (Normal 88-227 mL)

RV ESV: 70mL (Normal 23-103 mL)

RV SV: 123mL (Normal 52-138 mL)

CO: 8.1L/min (Normal 2.8-8.8 L/min)

Indexed volumes:

RV EDV: 91mL/sq-m (Normal 55-105 mL/sq-m)

RV ESV: 33mL/sq-m (Normal 15-43 mL/sq-m)

RV SV: 58mL/sq-m (Normal 32-64 mL/sq-m)

CI: 3.8L/min/sq-m (Normal 1.7-4.2 L/min/sq-m)

Left atrium: Normal size

Right atrium: Normal size

Mitral valve: No regurgitation

Aortic valve: No regurgitation

Tricuspid valve: Trivial regurgitation

Pulmonic valve: No regurgitation

Aorta: Normal proximal ascending aorta

Pericardium: Small effusion
IMPRESSION: 1. Mild LV hypertrophy measuring up to 13mm in septum (12mm in
posterior wall), not meeting criteria for hypertrophic
cardiomyopathy (<15mm)

2.  No late gadolinium enhancement to suggest myocardial scar

3.  Normal LV size and systolic function (EF 70%)

4.  Normal RV size and systolic function (EF 64%)

## 2023-08-03 ENCOUNTER — Encounter: Payer: Self-pay | Admitting: Podiatry

## 2023-08-03 ENCOUNTER — Ambulatory Visit (INDEPENDENT_AMBULATORY_CARE_PROVIDER_SITE_OTHER): Payer: Medicaid Other | Admitting: Podiatry

## 2023-08-03 DIAGNOSIS — B351 Tinea unguium: Secondary | ICD-10-CM | POA: Diagnosis not present

## 2023-08-03 DIAGNOSIS — G43909 Migraine, unspecified, not intractable, without status migrainosus: Secondary | ICD-10-CM | POA: Insufficient documentation

## 2023-08-03 DIAGNOSIS — Z79899 Other long term (current) drug therapy: Secondary | ICD-10-CM | POA: Diagnosis not present

## 2023-08-03 NOTE — Progress Notes (Signed)
  Subjective:  Patient ID: James Moses, male    DOB: 31-Dec-2000,  MRN: [id number]  Chief Complaint  Patient presents with   Nail Problem    Nail discoloration     22 y.o. male presents with the above complaint.  Patient presents for bilateral hallux third fifth times a day taken Condition for mycotic nail.  He would like to discuss treatment options for.  Has tried some topical which has not helped.  He denies any other acute complaints.  He has not tried an oral medication   Review of Systems: Negative except as noted in the HPI. Denies N/V/F/Ch.  Past Medical History:  Diagnosis Date   Headache(784.0)     Current Outpatient Medications:    ibuprofen (ADVIL) 800 MG tablet, Take 1 tablet (800 mg total) by mouth 3 (three) times daily. (Patient not taking: Reported on 04/12/2023), Disp: 21 tablet, Rfl: 0  Social History   Tobacco Use  Smoking Status Never  Smokeless Tobacco Never    No Known Allergies Objective:  There were no vitals filed for this visit. There is no height or weight on file to calculate BMI. Constitutional Well developed. Well nourished.  Vascular Dorsalis pedis pulses palpable bilaterally. Posterior tibial pulses palpable bilaterally. Capillary refill normal to all digits.  No cyanosis or clubbing noted. Pedal hair growth normal.  Neurologic Normal speech. Oriented to person, place, and time. Epicritic sensation to light touch grossly present bilaterally.  Dermatologic Nails thickened and dystrophic mycotic toenails x 8 to bilateral hallux third and fifth digit Skin within normal limits  Orthopedic: Normal joint ROM without pain or crepitus bilaterally. No visible deformities. No bony tenderness.   Radiographs: None Assessment:   1. Onychomycosis   2. Long-term use of high-risk medication   3. Nail fungus    Plan:  Patient was evaluated and treated and all questions answered.  Bilateral hallux started fifth digit onychomycosis -Educated  the patient on the etiology of onychomycosis and various treatment options associated with improving the fungal load.  I explained to the patient that there is 3 treatment options available to treat the onychomycosis including topical, p.o., laser treatment.  Patient elected to undergo p.o. options with Lamisil/terbinafine therapy.  In order for me to start the medication therapy, I explained to the patient the importance of evaluating the liver and obtaining the liver function test.  Once the liver function test comes back normal I will start him on 15-month course of Lamisil therapy.  Patient understood all risk and would like to proceed with Lamisil therapy.  I have asked the patient to immediately stop the Lamisil therapy if she has any reactions to it and call the office or go to the emergency room right away.  Patient states understanding   No follow-ups on file.

## 2023-08-16 ENCOUNTER — Other Ambulatory Visit: Payer: Self-pay | Admitting: Podiatry

## 2023-08-17 LAB — HEPATIC FUNCTION PANEL
ALT: 24 IU/L (ref 0–44)
AST: 16 IU/L (ref 0–40)
Albumin: 4.6 g/dL (ref 4.3–5.2)
Alkaline Phosphatase: 92 IU/L (ref 44–121)
Bilirubin Total: 0.4 mg/dL (ref 0.0–1.2)
Bilirubin, Direct: <0.1 mg/dL (ref 0.00–0.40)
Total Protein: 7.3 g/dL (ref 6.0–8.5)

## 2023-08-23 MED ORDER — TERBINAFINE HCL 250 MG PO TABS: 250.0000 mg | ORAL_TABLET | Freq: Every day | ORAL | 0 refills | Status: DC

## 2023-08-23 NOTE — Addendum Note (Signed)
Addended by: Nicholes Rough on: 08/23/2023 07:58 AM   Modules accepted: Orders

## 2023-08-26 ENCOUNTER — Other Ambulatory Visit: Payer: Self-pay

## 2023-08-26 ENCOUNTER — Emergency Department (HOSPITAL_COMMUNITY)
Admission: EM | Admit: 2023-08-26 | Discharge: 2023-08-26 | Disposition: A | Discharge status: Home / Self Care | Payer: Medicaid Other | Attending: Emergency Medicine | Admitting: Emergency Medicine

## 2023-08-26 ENCOUNTER — Emergency Department (HOSPITAL_COMMUNITY): Payer: Medicaid Other

## 2023-08-26 DIAGNOSIS — R002 Palpitations: Secondary | ICD-10-CM | POA: Diagnosis present

## 2023-08-26 DIAGNOSIS — I4891 Unspecified atrial fibrillation: Secondary | ICD-10-CM | POA: Diagnosis not present

## 2023-08-26 LAB — CBC WITH DIFFERENTIAL/PLATELET
Abs Immature Granulocytes: 0.01 10*3/uL (ref 0.00–0.07)
Basophils Absolute: 0 10*3/uL (ref 0.0–0.1)
Basophils Relative: 1 %
Eosinophils Absolute: 0.1 10*3/uL (ref 0.0–0.5)
Eosinophils Relative: 2 %
HCT: 48.8 % (ref 39.0–52.0)
Hemoglobin: 15.8 g/dL (ref 13.0–17.0)
Immature Granulocytes: 0 %
Lymphocytes Relative: 37 %
Lymphs Abs: 1.8 10*3/uL (ref 0.7–4.0)
MCH: 24.2 pg — ABNORMAL LOW (ref 26.0–34.0)
MCHC: 32.4 g/dL (ref 30.0–36.0)
MCV: 74.6 fL — ABNORMAL LOW (ref 80.0–100.0)
Monocytes Absolute: 0.5 10*3/uL (ref 0.1–1.0)
Monocytes Relative: 10 %
Neutro Abs: 2.4 10*3/uL (ref 1.7–7.7)
Neutrophils Relative %: 50 %
Platelets: 340 10*3/uL (ref 150–400)
RBC: 6.54 MIL/uL — ABNORMAL HIGH (ref 4.22–5.81)
RDW: 16.3 % — ABNORMAL HIGH (ref 11.5–15.5)
WBC: 4.8 10*3/uL (ref 4.0–10.5)
nRBC: 0 % (ref 0.0–0.2)

## 2023-08-26 LAB — BASIC METABOLIC PANEL
Anion gap: 10 (ref 5–15)
BUN: 6 mg/dL (ref 6–20)
CO2: 25 mmol/L (ref 22–32)
Calcium: 9.5 mg/dL (ref 8.9–10.3)
Chloride: 102 mmol/L (ref 98–111)
Creatinine, Ser: 1.02 mg/dL (ref 0.61–1.24)
GFR, Estimated: >60 mL/min (ref >60)
Glucose, Bld: 96 mg/dL (ref 70–99)
Potassium: 3.8 mmol/L (ref 3.5–5.1)
Sodium: 137 mmol/L (ref 135–145)

## 2023-08-26 LAB — TROPONIN I (HIGH SENSITIVITY)
Troponin I (High Sensitivity): 8 ng/L (ref <18)
Troponin I (High Sensitivity): 10 ng/L (ref <18)

## 2023-08-26 LAB — TSH: TSH: 0.942 u[IU]/mL (ref 0.350–4.500)

## 2023-08-26 LAB — MAGNESIUM: Magnesium: 1.8 mg/dL (ref 1.7–2.4)

## 2023-08-26 MED ORDER — ONDANSETRON HCL 4 MG/2ML IJ SOLN
4.0000 mg | Freq: Once | INTRAMUSCULAR | Status: AC
  Administered 2023-08-26: 4 mg via INTRAVENOUS
  Filled 2023-08-26: qty 2

## 2023-08-26 MED ORDER — FENTANYL CITRATE PF 50 MCG/ML IJ SOSY
50.0000 ug | PREFILLED_SYRINGE | Freq: Once | INTRAMUSCULAR | Status: AC
  Administered 2023-08-26: 50 ug via INTRAVENOUS
  Filled 2023-08-26: qty 1

## 2023-08-26 MED ORDER — METOPROLOL TARTRATE 5 MG/5ML IV SOLN
5.0000 mg | Freq: Once | INTRAVENOUS | Status: AC
  Administered 2023-08-26: 5 mg via INTRAVENOUS
  Filled 2023-08-26: qty 5

## 2023-08-26 MED ORDER — ETOMIDATE 2 MG/ML IV SOLN
10.0000 mg | Freq: Once | INTRAVENOUS | Status: AC
  Administered 2023-08-26: 10 mg via INTRAVENOUS
  Filled 2023-08-26: qty 10

## 2023-08-26 MED ORDER — SODIUM CHLORIDE 0.9 % IV BOLUS
1000.0000 mL | Freq: Once | INTRAVENOUS | Status: AC
  Administered 2023-08-26: 1000 mL via INTRAVENOUS

## 2023-08-26 NOTE — ED Notes (Signed)
..  The patient is A&OX4, ambulatory at d/c with independent steady gait, NAD. Pt verbalized understanding of d/c instructions and follow up care.  

## 2023-08-26 NOTE — ED Provider Notes (Signed)
[place] EMERGENCY DEPARTMENT AT [facility] Provider Note  CSN: [id number] Arrival date & time: 08/26/23 1215  Chief Complaint(s) Tachycardia  HPI James Moses is a 22 y.o. male with past medical history as below, significant for Dr Royann Shivers pAF, hypertrophic cardiomyopathy who presents to the ED with complaint of afib.   Patient reports that he was awakened suddenly this morning after a nightmare, was having palpitations, felt he was in A-fib.  Heart rate elevated.  Has been having fatigue since the onset of symptoms, mild chest tightness.  Normal state of health when he went to bed last night.  no recent licit drug or excessive stimulant use.  No dyspnea, abdominal pain nausea or vomiting  Past Medical History Past Medical History:  Diagnosis Date   Headache(784.0)    Patient Active Problem List   Diagnosis Date Noted   Migraines 08/03/2023   Need for vaccination 10/03/2020   Well child check 10/03/2020   Atrial fibrillation (HCC) 08/30/2019   Chest pain 09/03/2013   Palpitations 09/03/2013   Migraine without aura, without mention of intractable migraine without mention of status migrainosus 08/29/2013   Tension type headache, unspecified 08/29/2013   Home Medication(s) Prior to Admission medications   Medication Sig Start Date End Date Taking? Authorizing Provider  ibuprofen (ADVIL) 800 MG tablet Take 1 tablet (800 mg total) by mouth 3 (three) times daily. Patient not taking: Reported on 04/12/2023 09/09/20   Wieters, Hallie C, PA-C  terbinafine (LAMISIL) 250 MG tablet Take 1 tablet (250 mg total) by mouth daily. Patient not taking: Reported on 08/26/2023 08/23/23   Candelaria Stagers, DPM                                                                                                                                    Past Surgical History No past surgical history on file. Family History Family History  Problem Relation Age of Onset   Headache Father    Cancer  Maternal Grandmother    Heart attack Maternal Grandfather    Stroke Paternal Grandmother        Died at 87    Social History Social History   Tobacco Use   Smoking status: Never   Smokeless tobacco: Never  Vaping Use   Vaping status: Never Used  Substance Use Topics   Drug use: Not Currently   Allergies Patient has no known allergies.  Review of Systems Review of Systems  Constitutional:  Positive for fatigue.  Respiratory:  Positive for chest tightness.   Cardiovascular:  Positive for palpitations.  All other systems reviewed and are negative.   Physical Exam Vital Signs  I have reviewed the triage vital signs BP 109/70   Pulse 79   Temp 97.8 F (36.6 C) (Oral)   Resp (!) 22   SpO2 99%  Physical Exam Vitals and nursing note reviewed.  Constitutional:      General:  He is not in acute distress.    Appearance: Normal appearance. He is well-developed.  HENT:     Head: Normocephalic and atraumatic.     Right Ear: External ear normal.     Left Ear: External ear normal.     Mouth/Throat:     Mouth: Mucous membranes are moist.  Eyes:     General: No scleral icterus. Cardiovascular:     Rate and Rhythm: Tachycardia present. Rhythm irregular.     Pulses: Normal pulses.     Heart sounds: Normal heart sounds.  Pulmonary:     Effort: Pulmonary effort is normal. No respiratory distress.     Breath sounds: Normal breath sounds.  Abdominal:     General: Abdomen is flat.     Palpations: Abdomen is soft.     Tenderness: There is no abdominal tenderness.  Musculoskeletal:     Cervical back: No rigidity.     Right lower leg: No edema.     Left lower leg: No edema.  Skin:    General: Skin is warm and dry.     Capillary Refill: Capillary refill takes less than 2 seconds.  Neurological:     Mental Status: He is alert and oriented to person, place, and time.     GCS: GCS eye subscore is 4. GCS verbal subscore is 5. GCS motor subscore is 6.  Psychiatric:        Mood  and Affect: Mood normal.        Behavior: Behavior normal.     ED Results and Treatments Labs (all labs ordered are listed, but only abnormal results are displayed) Labs Reviewed  CBC WITH DIFFERENTIAL/PLATELET - Abnormal; Notable for the following components:      Result Value   RBC 6.54 (*)    MCV 74.6 (*)    MCH 24.2 (*)    RDW 16.3 (*)    All other components within normal limits  BASIC METABOLIC PANEL  MAGNESIUM  TSH  TROPONIN I (HIGH SENSITIVITY)  TROPONIN I (HIGH SENSITIVITY)                                                                                                                          Radiology DG Chest Portable 1 View  Result Date: 08/26/2023 CLINICAL DATA:  Tachycardia.  Atrial fibrillation. EXAM: PORTABLE CHEST 1 VIEW COMPARISON:  None Available. FINDINGS: Bilateral lung fields are clear. Bilateral lateral costophrenic angles are clear. Normal cardio-mediastinal silhouette. No acute osseous abnormalities. The soft tissues are within normal limits. IMPRESSION: No active disease. Electronically Signed   By: Jules Schick M.D.   On: 08/26/2023 14:04    Pertinent labs & imaging results that were available during my care of the patient were reviewed by me and considered in my medical decision making (see MDM for details).  Medications Ordered in ED Medications  sodium chloride 0.9 % bolus 1,000 mL (0 mLs Intravenous Stopped 08/26/23 1406)  metoprolol tartrate (LOPRESSOR) injection 5  mg (5 mg Intravenous Given 08/26/23 1419)                                                                                                                                     Procedures Procedures  (including critical care time)  Medical Decision Making / ED Course    Medical Decision Making:    [name] is a 22 y.o. male history above here with A-fib. The complaint involves an extensive differential diagnosis and also carries with it a high risk of complications and  morbidity.  Serious etiology was considered. Ddx includes but is not limited to: A-fib with RVR, dysrhythmia, electrolyte derangement, metabolic derangement, ACS, dehydration, etc.  Complete initial physical exam performed, notably the patient  was no acute distress, A-fib with RVR noted on telemetry.    Reviewed and confirmed nursing documentation for past medical history, family history, social history.  Vital signs reviewed.    Clinical Course as of 08/26/23 1555  Fri Aug 26, 2023  1247 EKG with afib RVR [SG]  1539 Received sign out from Dr. Wallace Cullens. Presenting with tachycardia, atrial fibrillation. Not on medication. History of HOCUM. CHADs2Vasc is 0. Follows with cardiology.  [WS]    Clinical Course User Index [SG] Sloan Leiter, DO [WS] Lonell Grandchild, MD   Dr Royann Shivers pAF, hypertrophic cardiomyopathy  Cardiac MRI 3/23 with mild LVH  Per cardiology documentation recommended for trial of flecainide if symptoms recur, no anticoagulation.   Will collect screening labs, give IV fluids, will give beta-blocker.   Rate control with IVF 1L and 5mg  metoprolol  Feeling better overall  Delta trop pending  Have pt f/u with cardiology for med mgmt  Signed out pending delta trop                Additional history obtained: -Additional history obtained from father -External records from outside source obtained and reviewed including: Chart review including previous notes, labs, imaging, consultation notes including  Cardiology documentation, prior echo, prior imaging and home medications   Lab Tests: -I ordered, reviewed, and interpreted labs.   The pertinent results include:   Labs Reviewed  CBC WITH DIFFERENTIAL/PLATELET - Abnormal; Notable for the following components:      Result Value   RBC 6.54 (*)    MCV 74.6 (*)    MCH 24.2 (*)    RDW 16.3 (*)    All other components within normal limits  BASIC METABOLIC PANEL  MAGNESIUM  TSH  TROPONIN I (HIGH  SENSITIVITY)  TROPONIN I (HIGH SENSITIVITY)    Notable for stable labs  EKG   EKG Interpretation Date/Time:  Friday August 26 2023 14:29:12 EDT Ventricular Rate:  90 PR Interval:    QRS Duration:  91 QT Interval:  342 QTC Calculation: 405 R Axis:   106  Text Interpretation: Atrial fibrillation Ventricular premature complex Borderline right axis deviation Borderline T abnormalities, inferior leads  ST elev, probable normal early repol pattern Atrial flutter Confirmed by Tanda Rockers ([id number]) on 08/26/2023 2:36:22 PM         Imaging Studies ordered: I ordered imaging studies including chest x-ray I independently visualized the following imaging with scope of interpretation limited to determining acute life threatening conditions related to emergency care; findings noted above, significant for stable xr I independently visualized and interpreted imaging. I agree with the radiologist interpretation   Medicines ordered and prescription drug management: Meds ordered this encounter  Medications   sodium chloride 0.9 % bolus 1,000 mL   metoprolol tartrate (LOPRESSOR) injection 5 mg    -I have reviewed the patients home medicines and have made adjustments as needed   Consultations Obtained: na   Cardiac Monitoring: The patient was maintained on a cardiac monitor.  I personally viewed and interpreted the cardiac monitored which showed an underlying rhythm of: afib rvr  Social Determinants of Health:  Diagnosis or treatment significantly limited by social determinants of health: na   Reevaluation: After the interventions noted above, I reevaluated the patient and found that they have improved  Co morbidities that complicate the patient evaluation  Past Medical History:  Diagnosis Date   Headache(784.0)       Dispostion: Disposition decision including need for hospitalization was considered, and patient disposition pending at time of sign out.    Final Clinical  Impression(s) / ED Diagnoses Final diagnoses:  Atrial fibrillation with RVR (HCC)        Sloan Leiter, DO 08/26/23 1555

## 2023-08-26 NOTE — ED Triage Notes (Signed)
Patient with hx of afib here for eval of heart racing that started at 0500 today after waking up from a nightmare. Denies chest pain or sob.

## 2023-08-26 NOTE — ED Notes (Signed)
Dr. Scheving at bedside 

## 2023-08-26 NOTE — ED Provider Notes (Signed)
Physical Exam  BP 115/70   Pulse 86   Temp 98.5 F (36.9 C) (Oral)   Resp 16   SpO2 100%   Physical Exam Vitals and nursing note reviewed.  Constitutional:      General: He is not in acute distress.    Appearance: Normal appearance.  HENT:     Mouth/Throat:     Mouth: Mucous membranes are moist.  Eyes:     Conjunctiva/sclera: Conjunctivae normal.  Cardiovascular:     Rate and Rhythm: Tachycardia present. Rhythm irregular.  Pulmonary:     Effort: Pulmonary effort is normal. No respiratory distress.     Breath sounds: Normal breath sounds.  Abdominal:     General: Abdomen is flat.     Palpations: Abdomen is soft.     Tenderness: There is no abdominal tenderness.  Musculoskeletal:     Right lower leg: No edema.     Left lower leg: No edema.  Skin:    General: Skin is warm and dry.     Capillary Refill: Capillary refill takes less than 2 seconds.  Neurological:     Mental Status: He is alert and oriented to person, place, and time. Mental status is at baseline.  Psychiatric:        Mood and Affect: Mood normal.        Behavior: Behavior normal.     Procedures  .Critical Care  Performed by: Lonell Grandchild, MD Authorized by: Lonell Grandchild, MD   Critical care provider statement:    Critical care time (minutes):  30   Critical care was necessary to treat or prevent imminent or life-threatening deterioration of the following conditions:  Cardiac failure   Critical care was time spent personally by me on the following activities:  Development of treatment plan with patient or surrogate, discussions with consultants, evaluation of patient's response to treatment, examination of patient, ordering and review of laboratory studies, ordering and review of radiographic studies, ordering and performing treatments and interventions, pulse oximetry, re-evaluation of patient's condition and review of old charts .Cardioversion  Date/Time: 08/26/2023 6:07 PM  Performed by:  Lonell Grandchild, MD Authorized by: Lonell Grandchild, MD   Consent:    Consent obtained:  Verbal and written   Consent given by:  Patient   Risks discussed:  Cutaneous burn, death, induced arrhythmia and pain   Alternatives discussed:  No treatment and observation Pre-procedure details:    Cardioversion basis:  Elective   Rhythm:  Atrial fibrillation   Electrode placement:  Anterior-posterior Patient sedated: Yes. Refer to sedation procedure documentation for details of sedation.  Attempt one:    Cardioversion mode:  Synchronous   Shock (Joules):  120   Shock outcome:  Conversion to normal sinus rhythm Post-procedure details:    Patient status:  Awake   Patient tolerance of procedure:  Tolerated well, no immediate complications .Sedation  Date/Time: 08/26/2023 6:07 PM  Performed by: Lonell Grandchild, MD Authorized by: Lonell Grandchild, MD   Consent:    Consent obtained:  Verbal and written   Consent given by:  Patient   Risks discussed:  Allergic reaction, dysrhythmia, inadequate sedation, nausea, vomiting, respiratory compromise necessitating ventilatory assistance and intubation, prolonged sedation necessitating reversal and prolonged hypoxia resulting in organ damage   Alternatives discussed:  Analgesia without sedation Universal protocol:    Procedure explained and questions answered to patient or proxy's satisfaction: yes     Relevant documents present and verified: yes  Test results available: yes     Imaging studies available: yes     Required blood products, implants, devices, and special equipment available: yes     Site/side marked: yes     Immediately prior to procedure, a time out was called: yes     Patient identity confirmed:  Arm band, hospital-assigned identification number and verbally with patient Indications:    Procedure performed:  Cardioversion   Procedure necessitating sedation performed by:  Physician performing sedation Pre-sedation  assessment:    Time since last food or drink:  1 hr   NPO status caution: urgency dictates proceeding with non-ideal NPO status     ASA classification: class 2 - patient with mild systemic disease     Mallampati score:  I - soft palate, uvula, fauces, pillars visible   Pre-sedation assessments completed and reviewed: airway patency, cardiovascular function, hydration status, mental status, nausea/vomiting, pain level, respiratory function and temperature   Immediate pre-procedure details:    Reassessment: Patient reassessed immediately prior to procedure     Reviewed: vital signs and relevant labs/tests     Verified: bag valve mask available, emergency equipment available, intubation equipment available, IV patency confirmed, oxygen available and reversal medications available   Procedure details (see MAR for exact dosages):    Preoxygenation:  Nasal cannula   Sedation:  Etomidate   Intended level of sedation: deep   Analgesia:  Fentanyl   Intra-procedure monitoring:  Cardiac monitor, continuous capnometry, blood pressure monitoring, continuous pulse oximetry, frequent LOC assessments and frequent vital sign checks   Intra-procedure events: none     Total Provider sedation time (minutes):  15 Post-procedure details:    Post-sedation assessment completed:  08/26/2023 6:09 PM   Attendance: Constant attendance by certified staff until patient recovered     Recovery: Patient returned to pre-procedure baseline     Post-sedation assessments completed and reviewed: airway patency, cardiovascular function, hydration status, mental status, nausea/vomiting, pain level, respiratory function and temperature     Patient is stable for discharge or admission: yes     Procedure completion:  Tolerated well, no immediate complications   ED Course / MDM   Clinical Course as of 08/26/23 1809  Fri Aug 26, 2023  1247 EKG with afib RVR [SG]  1539 Received sign out from Dr. Wallace Cullens. Presenting with tachycardia,  atrial fibrillation. Not on medication. History of HOCUM. CHADs2Vasc is 0. Follows with cardiology.  [WS]  1728 Reassessed patient.  He remains in A-fib.  He tells me that he is 100% sure that it started today, every other time he is has had A-fib he has been symptomatic from it with sensation of palpitation.  He reports previously he has always had cardioversion.  He did eat a sandwich recently unfortunately but otherwise is very healthy, and I think cardioversion is probably the best option for him still.  Discussed sedation and cardioversion, patient understands the risks and benefits of the procedure including risk of stroke, risk of aspiration.  Will give very short acting sedative.  He has tolerated this previously.  Believe the risk of stroke is exceedingly low since he knows that his symptoms began today and did not began yesterday.  Hopefully he will be able to be cardioverted and discharged home [WS]  1804 Patient cardioverted without difficulty.  Postprocedural EKG shows sinus rhythm.  Uneventful recovery from anesthesia.  He is tolerating p.o.  He feels much better now.  Advise follow-up with his cardiologist. Will discharge patient to home. All  questions answered. Patient comfortable with plan of discharge. Return precautions discussed with patient and specified on the after visit summary.  [WS]    Clinical Course User Index [SG] Sloan Leiter, DO [WS] Lonell Grandchild, MD   Medical Decision Making Amount and/or Complexity of Data Reviewed Labs: ordered. Radiology: ordered.  Risk Prescription drug management.         Lonell Grandchild, MD 08/26/23 [phone]

## 2023-08-26 NOTE — ED Notes (Signed)
Pt able to ambulate independently and able to eat and drink without problems.

## 2023-08-26 NOTE — Discharge Instructions (Signed)
We evaluated you for your episode of atrial fibrillation were able to shock your heart back into a normal rhythm.  Please follow-up with your cardiologist.  We have placed an appointment request to help expedite this follow-up.  Please discuss with your cardiologist whether you need to be on any long-term medications to help control this.  If you have any recurrent symptoms, lightheadedness or dizziness, chest pain, fevers or chills, fainting, or any other concerning symptoms, please return to the emergency department.

## 2023-08-30 NOTE — Progress Notes (Deleted)
Cardiology Office Note:    Date:  08/30/2023   ID:  James Moses, DOB 25-Mar-2001, MRN [id number]  PCP:  Pcp, No  Cardiologist:  Thurmon Fair, MD  Electrophysiologist:  None   Referring MD: Sloan Leiter, DO   Chief Complaint: follow-up of atrial fibrillation  History of Present Illness:    James Moses is a 22 y.o. male with a history of paroxysmal atrial fibrillation not on anticoagulation given low CHA2DS2-VASc score and questionable diagnosis of hypertrophic cardiomyopathy who is followed by Dr. Royann Shivers and presents today for follow-up of atrial fibrillation.  Patient has a history of paroxsymal atrial fibrillation. He had his first episode in 08/2019 and then another one in 11/2019 both of which occurred at rest. The second event required electrical cardioversion. He was seen by EP at [facility] and underwent an EP study in 09/2019 which did not identify an accessory pathway, AVNRT, or any other triggering arrhythmia. Cardiac MRI in 02/2022 showed normal LV size and function (EF 70%), normal RV size and function (EF 64), and mild LV hypertrophy measuring up to 13 mm in septum (not meeting criteria for hypertrophic cardiomyopathy). There was no late gadolinium enhancement to suggest myocardial scar.  He was last seen by Dr. Royann Shivers in 03/2023 at which time he was doing well without any cardiac symptoms.  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 Plan was to repeat Echo every 3-5 years and cardiac MRI every 5-10 years. Repeat Echo was ordered at that visit and showed LVEF of 60-65% with normal wall motion and mild LVH.  He was recently seen in the ED on 08/26/2023 for sudden onset of palpitations, fatigue, and mild chest tightness and was found to be in atrial fibrillation with rates in the 160s. High-sensitivity troponin negative x2. TSH and electrolytes  were normal.  He was given IV fluids and a dose of IV Lopressor. He was cardioverted in the ED with restoration of sinus rhythm and felt to be stable for discharge. Of note, he was not started on anticoagulation given patient was 100% he went into atrial fibrillation that same day.  Patient presents today for follow-up. ***  Paroxysmal Atrial Fibrillation Patient has a history of paroxysmal atrial fibrillation dating back to 2020. EP study in 09/2019 did not identify an accessory pathway, AVNRT, or any other triggering arrhythmia. Episodes have been very infrequent. He had not had any recurrence in 3 years until recently when he was seen in the ED with atrial fibrillation with rates in the 160s. He underwent successful DCCV with restoration of sinus rhythm.  - Maintaining sinus rhythm on exam *** - Dr. Royann Shivers recommended a trial of Flecainide "pill in pocket" if he had any recurrent episodes. Therefore, will prescribed Flecainide 300mg  for patient to take as needed for persistent tachy-palpitations. *** - CHA2DS2-VASc = 0. Therefore, no indications for anticoagulation at this time. ***  LV Thickening Cardiac MRI in 02/2022 showed normal LV size and function (EF 70%), normal RV size and function (EF 64), and mild LV hypertrophy measuring up to 13 mm in septum (not meeting criteria for hypertrophic cardiomyopathy). There was no late gadolinium enhancement to suggest myocardial scar. Echo in 04/2023 showed LVEF of 60-65% with normal wall motion and mild LVH. He does not meet threshold for diagnosis of hypertrophic cardiomyopathy. However, per Dr. 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 "Even if he has hypertrophic cardiomyopathy has no evidence of LVOT obstruction or any high risk features such as history of VT/syncope/family history of unexpected sudden death/excessive hypertrophy/severe scarring on LGE." Plan is for routine monitor  with Echo every 3-5 years and cardiac MRI every 5-10 years (unless he develops new symptoms or a ventricular arrhythmia).    EKGs/Labs/Other Studies Reviewed:    The following studies were reviewed:  Cardiac MRI 03/01/2022: Impressions: 1. Mild LV hypertrophy measuring up to 13mm in septum (12mm in posterior wall), not meeting criteria for hypertrophic cardiomyopathy (<54mm) 2.  No late gadolinium enhancement to suggest myocardial scar 3.  Normal LV size and systolic function (EF 70%) 4.  Normal RV size and systolic function (EF 64%) _______________  Echocardiogram 05/16/2023: Impressions: 1. Left ventricular ejection fraction, by estimation, is 60 to 65%. The  left ventricle has normal function. The left ventricle has no regional  wall motion abnormalities. There is mild concentric left ventricular  hypertrophy. Left ventricular diastolic  parameters were normal. The average left ventricular global longitudinal  strain is -29.4 %. The global longitudinal strain is normal.   2. Right ventricular systolic function is normal. The right ventricular  size is normal.   3. The mitral valve is normal in structure. Trivial mitral valve  regurgitation. No evidence of mitral stenosis.   4. The aortic valve is tricuspid. Aortic valve regurgitation is not  visualized. No aortic stenosis is present.   5. The inferior vena cava is normal in size with greater than 50%  respiratory variability, suggesting right atrial pressure of 3 mmHg.   EKG:  EKG ordered today. EKG personally reviewed and demonstrates ***.  Recent Labs: 08/16/2023: ALT 24 08/26/2023: BUN 6; Creatinine, Ser 1.02; Hemoglobin 15.8; Magnesium 1.8; Platelets 340; Potassium 3.8; Sodium 137; TSH 0.942  Recent Lipid Panel No results found for: "CHOL", "TRIG", "HDL", "CHOLHDL", "VLDL", "LDLCALC", "LDLDIRECT"  Physical Exam:    Vital Signs: There were no vitals taken for this visit.    Wt Readings from Last 3 Encounters:  04/12/23  223 lb 12.8 oz (101.5 kg)  01/27/22 204 lb 9.6 oz (92.8 kg) (94%, Z= 1.55)*  11/26/19 191 lb 2.2 oz (86.7 kg) (93%, Z= 1.48)*   * Growth percentiles are based on CDC (Boys, 2-20 Years) data.     General: 22 y.o. male in no acute distress. HEENT: Normocephalic and atraumatic. Sclera clear. EOMs intact. Neck: Supple. No carotid bruits. No JVD. Heart: *** RRR. Distinct S1 and S2. No murmurs, gallops, or rubs. Radial and distal pedal pulses 2+ and equal bilaterally. Lungs: No increased work of breathing. Clear to ausculation bilaterally. No wheezes, rhonchi, or rales.  Abdomen: Soft, non-distended, and non-tender to palpation. Bowel sounds present in all 4 quadrants.  MSK: Normal strength and tone for age. *** Extremities: No lower extremity edema.    Skin: Warm and dry. Neuro: Alert and oriented x3. No focal deficits. Psych: Normal affect. Responds appropriately.   Assessment:    No diagnosis found.  Plan:     Disposition: Follow up in ***   Medication Adjustments/Labs and Tests Ordered: Current medicines are reviewed at length with the patient today.  Concerns regarding medicines are outlined above.  No orders of the defined types were placed in this encounter.  No orders of the defined types were placed in this encounter.   There are no Patient Instructions on file for this visit.   Signed, Kelton Pillar  Larene Beach  08/30/2023 8:39 PM    [facility]

## 2023-09-04 NOTE — Progress Notes (Unsigned)
Cardiology Office Note:  .   Date:  09/04/2023  ID:  James Moses, DOB Oct 05, 2001, MRN [id number] PCP: Pcp, No  [facility] Providers Cardiologist:  Thurmon Fair, MD { Click to update primary MD,subspecialty MD or APP then REFRESH:1}   History of Present Illness: .   James Moses is a 22 y.o. male with past medical history of paroxysmal atrial fibrillation not on anticoagulation Kaleab and low CHA2DS2-VASc score and questionable diagnosis of hypertrophic cardiomyopathy.  He is followed by Dr. Royann Shivers, he presents today for follow-up of atrial fibrillation.  Mr. Burget has a history of paroxysmal atrial fibrillation.  His first episode occurred in 08/2019 followed by another episode in 11/2019, both occurring at rest.  Second event required cardioversion.  He was seen at [facility] by EP and underwent an EP study in 09/2019 which did not identify an accessory pathway, AVNRT or any other triggering arrhythmia.  He had a cardiac MRI in 02/2022 that showed normal LV size and function, with an EF of 70%, normally RV size and function EF 64% and mild LV hypertrophy measuring up to 13 mm in septum which did not meet criteria for hypertrophic cardiomyopathy.  There was no late gadolinium enhancement to suggest myocardial scar.  She was last seen by Dr. Royann Shivers in 03/2023, at that time he had remained stable from a cardiac perspective.  His prior cardiac MRI did not meet threshold for diagnosis of hypertrophic cardiomyopathy but his history of atrial fibrillation and mild EKG changes were felt to be suggestive of mild cardiomyopathy.  Dr. Erin Hearing plan was to repeat echo every 3 to 5 years and cardiac MRI every 5 to 10 years.  His last echocardiogram in 01/2023 indicated an LVEF of 60 to 65% with normal wall motion and mild LVH.  On 08/26/2023 he presented to the ED for sudden onset of palpitations, fatigue and mild chest tightness.  He was found to be in atrial fibrillation with rates in the 160s.   High-sensitivity troponin was negative x 2, TSH and electrolytes were normal.  He was given IV fluids and a dose of IV Lopressor.  He was cardioverted in the ED with restoration of sinus rhythm and felt to be stable for discharge.  He was not started on anticoagulation ??  Today Mr. Baum presents for follow up. He reports he is doing   Paroxysmal atrial fibrillation: He has a history of paroxysmal atrial fibrillation back to 2020.  He underwent EP study in 09/2019 that did not identify an accessory pathway the NRT or any other.  Very infrequent episodes.  Had any recurrence in 3 years until he was seen in the ED with atrial fibrillation with rates in the 160s on 08/26/2023.  He underwent successful DCCV with restoration of sinus rhythm. EKG today shows Dr. Royann Shivers had previously recommended a trial of flecainide, pill in pocket for any recurrent episodes.  Will prescribe patient flecainide 300 mg as needed for persistent tachypalpitations. His CHA2DS2-VASc score is 0, no indications for anticoagulation at this time ***  LV thickening: Underwent cardiac MRI in 02/2022 that showed normal LV size and function, EF 70%, normal RV size and function, EF 64% and mild LV hypertrophy measuring up to 13 mm in the septum which did not meet criteria for hypertrophic cardiomyopathy.  There is no suggestion of myocardial scar.  His repeat echo in 04/2023 showed LVEF of 60 to 65% with with normal RWMA and mild LVH.  He does not meet requirements  for diagnosis of hypertrophic cardiomyopathy, however per Dr. Erin Hearing last note history of mild EKG changes are suggestive of mild myopathy.  "Even if he has hypertrophic cardiomyopathy has no evidence of LVOT obstruction or any high risk features such as history of VT/syncope/family history of unexpected sudden death/excessive hypertrophy/severe scarring on LGE." Plan is for routine monitor with Echo every 3-5 years and cardiac MRI every 5-10 years (unless he develops new  symptoms or a ventricular arrhythmia).    ROS: ****** denies chest pain, shortness of breath, lower extremity edema, fatigue, palpitations, melena, hematuria, hemoptysis, diaphoresis, weakness, presyncope, syncope, orthopnea, and PND.   Studies Reviewed: .       Cardiac Studies & Procedures       ECHOCARDIOGRAM  ECHOCARDIOGRAM COMPLETE 05/16/2023  Narrative ECHOCARDIOGRAM REPORT    Patient Name:   James Moses Date of Exam: 05/16/2023 Medical Rec #:  [id number]      Height:       70.0 in Accession #:    [id number]     Weight:       223.8 lb Date of Birth:  2001-02-06      BSA:          2.189 m Patient Age:    21 years       BP:           110/70 mmHg Patient Gender: M              HR:           68 bpm. 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 Arrythmias:Paroxysmal Atrial Fibrillation.  Sonographer:    Sedonia Small Rodgers-Jones RDCS Referring Phys: [id number] MIHAI CROITORU  IMPRESSIONS   1. Left ventricular ejection fraction, by estimation, is 60 to 65%. The left ventricle has normal function. The left ventricle has no regional wall motion abnormalities. There is mild concentric left ventricular hypertrophy. Left ventricular diastolic parameters were normal. The average left ventricular global longitudinal strain is -29.4 %. The global longitudinal strain is normal. 2. Right ventricular systolic function is normal. The right ventricular size is normal. 3. The mitral valve is normal in structure. Trivial mitral valve regurgitation. No evidence of mitral stenosis. 4. The aortic valve is tricuspid. Aortic valve regurgitation is not visualized. No aortic stenosis is present. 5. The inferior vena cava is normal in size with greater than 50% respiratory variability, suggesting right atrial pressure of 3  mmHg.  FINDINGS Left Ventricle: Left ventricular ejection fraction, by estimation, is 60 to 65%. The left ventricle has normal function. The left ventricle has no regional wall motion abnormalities. The average left ventricular global longitudinal strain is -29.4 %. The global longitudinal strain is normal. The left ventricular internal cavity size was normal in size. There is mild concentric left ventricular hypertrophy. Left ventricular diastolic parameters were normal.  Right Ventricle: The right ventricular size is normal. No increase in right ventricular wall thickness. Right ventricular systolic function is normal.  Left Atrium: Left atrial size was normal in size.  Right Atrium: Right atrial size was normal in size.  Pericardium: There is no evidence of pericardial effusion.  Mitral Valve: The mitral valve is normal in structure. Trivial mitral valve regurgitation. No evidence of mitral valve stenosis.  Tricuspid Valve: The tricuspid valve is normal in structure.  Tricuspid valve regurgitation is not demonstrated. No evidence of tricuspid stenosis.  Aortic Valve: The aortic valve is tricuspid. Aortic valve regurgitation is not visualized. No aortic stenosis is present.  Pulmonic Valve: The pulmonic valve was normal in structure. Pulmonic valve regurgitation is mild. No evidence of pulmonic stenosis.  Aorta: The aortic root is normal in size and structure.  Venous: The inferior vena cava is normal in size with greater than 50% respiratory variability, suggesting right atrial pressure of 3 mmHg.  IAS/Shunts: No atrial level shunt detected by color flow Doppler.   LEFT VENTRICLE PLAX 2D LVIDd:         4.40 cm   Diastology LVIDs:         2.30 cm   LV e' medial:    8.76 cm/s LV PW:         1.19 cm   LV E/e' medial:  9.0 LV IVS:        1.23 cm   LV e' lateral:   14.33 cm/s LVOT diam:     2.10 cm   LV E/e' lateral: 5.5 LV SV:         68 LV SV Index:   31        2D Longitudinal  Strain LVOT Area:     3.46 cm  2D Strain GLS (A2C):   -28.7 % 2D Strain GLS (A3C):   -29.9 % 2D Strain GLS (A4C):   -29.7 % 2D Strain GLS Avg:     -29.4 %  3D Volume EF: 3D EF:        61 % LV EDV:       136 ml LV ESV:       52 ml LV SV:        84 ml  RIGHT VENTRICLE             IVC RV Basal diam:  3.50 cm     IVC diam: 1.30 cm RV S prime:     14.40 cm/s TAPSE (M-mode): 2.8 cm  LEFT ATRIUM             Index        RIGHT ATRIUM           Index LA diam:        3.60 cm 1.64 cm/m   RA Area:     11.40 cm LA Vol (A2C):   37.4 ml 17.08 ml/m  RA Volume:   28.30 ml  12.93 ml/m LA Vol (A4C):   32.5 ml 14.84 ml/m LA Biplane Vol: 34.9 ml 15.94 ml/m AORTIC VALVE LVOT Vmax:   103.33 cm/s LVOT Vmean:  67.233 cm/s LVOT VTI:    0.197 m  AORTA Ao Root diam: 2.90 cm Ao Asc diam:  2.90 cm  MITRAL VALVE MV Area (PHT): 3.45 cm    SHUNTS MV Decel Time: 220 msec    Systemic VTI:  0.20 m MV E velocity: 78.60 cm/s  Systemic Diam: 2.10 cm MV A velocity: 61.80 cm/s MV E/A ratio:  1.27  Chilton Si MD Electronically signed by Chilton Si MD Signature Date/Time: 05/16/2023/3:48:13 PM    Final      CARDIAC MRI  MR CARDIAC MORPHOLOGY W WO CONTRAST 03/01/2022  Narrative CLINICAL DATA:  HCM evaluation  EXAM: CARDIAC MRI  TECHNIQUE: The patient was scanned on a 1.5 Tesla Siemens magnet. A dedicated cardiac coil was used. Functional imaging was done using Fiesta sequences. 2,3, and 4 chamber views were done to assess for  RWMA's. Modified Simpson's rule using a short axis stack was used to calculate an ejection fraction on a dedicated work [employer]. The patient received 10 cc of Gadavist. After 10 minutes inversion recovery sequences were used to assess for infiltration and scar tissue.  CONTRAST:  10 cc  of Gadavist  FINDINGS: Left ventricle:  -Mild hypertrophy measuring up to 13mm in septum (12mm in posterior wall), not meeting criteria for  hypertrophic cardiomyopathy (<71mm)  -Normal size  -Normal systolic function  -Normal ECV (24%)  -No LGE  LV EF: 70% (Normal 56-78%)  Absolute volumes:  LV EDV: (Normal 77-195 mL)  LV ESV: 52mL (Normal 19-72 mL)  LV SV: (Normal 51-133 mL)  CO: 7.8L/min (Normal 2.8-8.8 L/min)  Indexed volumes:  LV EDV: 5mL/sq-m (Normal 47-92 mL/sq-m)  LV ESV: 73mL/sq-m (Normal 13-30 mL/sq-m)  LV SV: 6mL/sq-m (Normal 32-62 mL/sq-m)  CI: 3.7L/min/sq-m (Normal 1.7-4.2 L/min/sq-m)  Right ventricle: Normal size and systolic function  RV EF:  64% (Normal 47-74%)  Absolute volumes:  RV EDV: (Normal 88-227 mL)  RV ESV: 70mL (Normal 23-103 mL)  RV SV: (Normal 52-138 mL)  CO: 8.1L/min (Normal 2.8-8.8 L/min)  Indexed volumes:  RV EDV: 34mL/sq-m (Normal 55-105 mL/sq-m)  RV ESV: 12mL/sq-m (Normal 15-43 mL/sq-m)  RV SV: 50mL/sq-m (Normal 32-64 mL/sq-m)  CI: 3.8L/min/sq-m (Normal 1.7-4.2 L/min/sq-m)  Left atrium: Normal size  Right atrium: Normal size  Mitral valve: No regurgitation  Aortic valve: No regurgitation  Tricuspid valve: Trivial regurgitation  Pulmonic valve: No regurgitation  Aorta: Normal proximal ascending aorta  Pericardium: Small effusion  IMPRESSION: 1. Mild LV hypertrophy measuring up to 13mm in septum (12mm in posterior wall), not meeting criteria for hypertrophic cardiomyopathy (<91mm)  2.  No late gadolinium enhancement to suggest myocardial scar  3.  Normal LV size and systolic function (EF 70%)  4.  Normal RV size and systolic function (EF 64%)   Electronically Signed By: Epifanio Lesches M.D. On: 03/01/2022 23:47         *** Risk Assessment/Calculations:   {Does this patient have ATRIAL FIBRILLATION?:[phone]} No BP recorded.  {Refresh Note OR Click here to enter BP  :1}***       Physical Exam:   VS:  There were no vitals taken for this visit.   Wt Readings from Last 3 Encounters:  04/12/23 223 lb  12.8 oz (101.5 kg)  01/27/22 204 lb 9.6 oz (92.8 kg) (94%, Z= 1.55)*  11/26/19 191 lb 2.2 oz (86.7 kg) (93%, Z= 1.48)*   * Growth percentiles are based on CDC (Boys, 2-20 Years) data.    GEN: Well nourished, well developed in no acute distress NECK: No JVD; No carotid bruits CARDIAC: ***RRR, no murmurs, rubs, gallops RESPIRATORY:  Clear to auscultation without rales, wheezing or rhonchi  ABDOMEN: Soft, non-tender, non-distended EXTREMITIES:  No edema; No deformity   ASSESSMENT AND PLAN: .   ***    {Are you ordering a CV Procedure (e.g. stress test, cath, DCCV, TEE, etc)?   Press F2        :[id number]}  Dispo: ***  Signed, Rip Harbour, NP

## 2023-09-05 ENCOUNTER — Encounter: Payer: Self-pay | Admitting: Student

## 2023-09-05 ENCOUNTER — Ambulatory Visit: Payer: Medicaid Other | Attending: Student | Admitting: Cardiology

## 2023-09-05 VITALS — BP 100/62 | HR 80 | Ht 70.0 in | Wt 210.0 lb

## 2023-09-05 DIAGNOSIS — I422 Other hypertrophic cardiomyopathy: Secondary | ICD-10-CM

## 2023-09-05 DIAGNOSIS — I480 Paroxysmal atrial fibrillation: Secondary | ICD-10-CM

## 2023-09-05 NOTE — Patient Instructions (Signed)
Medication Instructions:  Your physician recommends that you continue on your current medications as directed. Please refer to the Current Medication list given to you today.  *If you need a refill on your cardiac medications before your next appointment, please call your pharmacy*  Follow-Up: At [facility], you and your health needs are our priority.  As part of our continuing mission to provide you with exceptional heart care, we have created designated Provider Care Teams.  These Care Teams include your primary Cardiologist (physician) and Advanced Practice Providers (APPs -  Physician Assistants and Nurse Practitioners) who all work together to provide you with the care you need, when you need it.  Your next appointment:   April 2025  Provider:   Thurmon Fair, MD     Other Instructions You have been referred to see an electrophysiologist

## 2023-11-18 ENCOUNTER — Institutional Professional Consult (permissible substitution): Payer: Medicaid Other | Admitting: Internal Medicine

## 2023-12-08 ENCOUNTER — Ambulatory Visit: Payer: Medicaid Other | Admitting: Cardiovascular Disease

## 2024-01-06 ENCOUNTER — Encounter: Payer: Self-pay | Admitting: Cardiology

## 2024-01-06 ENCOUNTER — Ambulatory Visit: Payer: Medicaid Other | Attending: Cardiology | Admitting: Cardiology

## 2024-01-06 VITALS — BP 130/78 | HR 74 | Ht 70.0 in | Wt 224.0 lb

## 2024-01-06 DIAGNOSIS — I480 Paroxysmal atrial fibrillation: Secondary | ICD-10-CM | POA: Diagnosis not present

## 2024-01-06 MED ORDER — METOPROLOL SUCCINATE ER 25 MG PO TB24: 25.0000 mg | ORAL_TABLET | ORAL | 3 refills | Status: AC | PRN

## 2024-01-06 MED ORDER — FLECAINIDE ACETATE 150 MG PO TABS: 300.0000 mg | ORAL_TABLET | Freq: Every day | ORAL | 3 refills | Status: AC | PRN

## 2024-01-06 NOTE — Patient Instructions (Addendum)
 Medication Instructions:  Your physician has recommended you make the following change in your medication:  1) START taking flecainide  300 mg once daily as needed for atrial fibrillation - please go to the ER when you take your first dose for monitoring.  2) TAKE Toprol  XL (metoprolol  succinate) 25 mg when you take a flecainide   *If you need a refill on your cardiac medications before your next appointment, please call your pharmacy*  Follow-Up: At [facility], you and your health needs are our priority.  As part of our continuing mission to provide you with exceptional heart care, we have created designated Provider Care Teams.  These Care Teams include your primary Cardiologist (physician) and Advanced Practice Providers (APPs -  Physician Assistants and Nurse Practitioners) who all work together to provide you with the care you need, when you need it.  Your next appointment:   6 months  Provider:   You may see Fonda Kitty, MD or one of the following Advanced Practice Providers on your designated Care Team:   Charlies Arthur, PA-C Michael Andy Tillery, PA-C Suzann Riddle, NP Daphne Barrack, NP

## 2024-01-06 NOTE — Progress Notes (Signed)
 Electrophysiology Office Note:   Date:  01/06/2024  ID:  James Moses, DOB 28-Feb-2001, MRN [id number]  Primary Cardiologist: Jerel Balding, MD Primary Heart Failure: None Electrophysiologist: Fonda Kitty, MD      History of Present Illness:   James Moses is a 23 y.o. male with h/o paroxysmal atrial fibrillation seen today for  for Electrophysiology evaluation at the request of Dr. Balding.   His first episode of atrial fibrillation occurred in 08/2019 followed by another episode in 11/2019.  His second episode required cardioversion.  He was seen at [facility] by EP and underwent an EP study in 09/2019 to see if SVT triggered atrial fibrillation. Per report, there was no evidence of pre-excitation. There was dual AV nodal physiology but no AVNRT, only single typical echos with double extra-stimuli on isoproterenol. No ablation was performed. He did well for many years with no episodes until August 2024, when he developed palpitations described as pounding sensation in the chest associated with shortness of breath. He denies dizziness, lightheadedness or any syncopal events.  He presented to ED and was found to have heart rates in the 160s. He underwent electrical cardioversion. He has had no events since. He has no identifiable triggers for his AF. He is a former athlete and remains active and otherwise healthy. He works as a water quality scientist. No new or acute complaints.  Review of systems complete and found to be negative unless listed in HPI.   EP Information / Studies Reviewed:    EKG is not ordered today. EKG from 09/05/23 reviewed which showed normal sinus rhythm with QRS duration of 98ms and PR interval of .      EKG 08/26/23:    Echo 05/16/23:   1. Left ventricular ejection fraction, by estimation, is 60 to 65%. The  left ventricle has normal function. The left ventricle has no regional  wall motion abnormalities. There is mild concentric left ventricular  hypertrophy. Left  ventricular diastolic  parameters were normal. The average left ventricular global longitudinal  strain is -29.4 %. The global longitudinal strain is normal.   2. Right ventricular systolic function is normal. The right ventricular  size is normal.   3. The mitral valve is normal in structure. Trivial mitral valve  regurgitation. No evidence of mitral stenosis.   4. The aortic valve is tricuspid. Aortic valve regurgitation is not  visualized. No aortic stenosis is present.   5. The inferior vena cava is normal in size with greater than 50%  respiratory variability, suggesting right atrial pressure of 3 mmHg.   Cardiac MRI 03/01/22: IMPRESSION: 1. Mild LV hypertrophy measuring up to 13mm in septum (12mm in posterior wall), not meeting criteria for hypertrophic cardiomyopathy (<3mm)   2.  No late gadolinium enhancement to suggest myocardial scar   3.  Normal LV size and systolic function (EF 70%)   4.  Normal RV size and systolic function (EF 64%)    Risk Assessment/Calculations:    CHA2DS2-VASc Score = 0   This indicates a 0.2% annual risk of stroke. The patient's score is based upon: CHF History: 0 HTN History: 0 Diabetes History: 0 Stroke History: 0 Vascular Disease History: 0 Age Score: 0 Gender Score: 0              Physical Exam:   VS:  BP 130/78   Pulse 74   Ht 5' 10 (1.778 m)   Wt 224 lb (101.6 kg)   SpO2 99%   BMI 32.14 kg/m  Wt Readings from Last 3 Encounters:  01/06/24 224 lb (101.6 kg)  09/05/23 210 lb (95.3 kg)  04/12/23 223 lb 12.8 oz (101.5 kg)     GEN: Well nourished, well developed in no acute distress NECK: No JVD CARDIAC: Normal rate and regular rhythm. RESPIRATORY:  Clear to auscultation without rales, wheezing or rhonchi  ABDOMEN: Soft, non-tender, non-distended EXTREMITIES:  No edema; No deformity   ASSESSMENT AND PLAN:   James Moses is a 23 y.o. male with h/o paroxysmal atrial fibrillation seen today for  for Electrophysiology  evaluation at the request of Dr. Francyne.   #. Paroxysmal atrial fibrillation: He has had 3 total sustained episodes. He underwent EP study at [facility] in 2020 to assess for SVT as trigger for AF. No SVT was induced at that time. Given no identifiable trigger and the relative infrequency of his episodes, I think a pill in the pocket approach is reasonable. If episodes become more frequent then catheter ablation with repeat EP study (try and reassess for SVT) would be reasonable. He has a normal LVEF. His LV septum measures 13mm but does not meet criteria for hypertrophic cardiomyopathy, potentially normal variant or hypertrophy in athlete's heart. No scar on MRI. No known structural heart disease or suspicion for coronary disease.  - Pill in the pocket flecainide  300mg  once daily as needed for atrial fibrillation. Instructed patient that first dose is to be taken in the ED under supervision, so next time he has sustained AF he should present to ED for administration. He voiced understanding with this.  - Metoprolol  XL 25mg  to be taken with flecainide .  - CHADSVASC score of 0. No oral anti-coagulation at this time.   Follow up with Dr. Kennyth in 6 months  Signed, Fonda Kennyth, MD

## 2024-06-26 ENCOUNTER — Other Ambulatory Visit: Payer: Self-pay

## 2024-06-26 ENCOUNTER — Emergency Department (HOSPITAL_COMMUNITY): Payer: Self-pay

## 2024-06-26 ENCOUNTER — Emergency Department (HOSPITAL_COMMUNITY)
Admission: EM | Admit: 2024-06-26 | Discharge: 2024-06-26 | Disposition: A | Discharge status: Home / Self Care | Payer: Self-pay

## 2024-06-26 DIAGNOSIS — I480 Paroxysmal atrial fibrillation: Secondary | ICD-10-CM | POA: Insufficient documentation

## 2024-06-26 LAB — CBC
HCT: 46.6 % (ref 39.0–52.0)
Hemoglobin: 15.7 g/dL (ref 13.0–17.0)
MCH: 26.9 pg (ref 26.0–34.0)
MCHC: 33.7 g/dL (ref 30.0–36.0)
MCV: 79.8 fL — ABNORMAL LOW (ref 80.0–100.0)
Platelets: 267 10*3/uL (ref 150–400)
RBC: 5.84 MIL/uL — ABNORMAL HIGH (ref 4.22–5.81)
RDW: 14.2 % (ref 11.5–15.5)
WBC: 6 10*3/uL (ref 4.0–10.5)
nRBC: 0 % (ref 0.0–0.2)

## 2024-06-26 LAB — BASIC METABOLIC PANEL WITH GFR
Anion gap: 9 (ref 5–15)
BUN: 11 mg/dL (ref 6–20)
CO2: 23 mmol/L (ref 22–32)
Calcium: 9.9 mg/dL (ref 8.9–10.3)
Chloride: 108 mmol/L (ref 98–111)
Creatinine, Ser: 1.12 mg/dL (ref 0.61–1.24)
GFR, Estimated: >60 mL/min (ref >60)
Glucose, Bld: 101 mg/dL — ABNORMAL HIGH (ref 70–99)
Potassium: 3.9 mmol/L (ref 3.5–5.1)
Sodium: 140 mmol/L (ref 135–145)

## 2024-06-26 LAB — TROPONIN I (HIGH SENSITIVITY)
Troponin I (High Sensitivity): 4 ng/L (ref <18)
Troponin I (High Sensitivity): 12 ng/L (ref <18)

## 2024-06-26 LAB — TSH: TSH: 1.088 u[IU]/mL (ref 0.350–4.500)

## 2024-06-26 LAB — MAGNESIUM: Magnesium: 1.8 mg/dL (ref 1.7–2.4)

## 2024-06-26 MED ORDER — DILTIAZEM HCL 25 MG/5ML IV SOLN
20.0000 mg | Freq: Once | INTRAVENOUS | Status: AC
  Administered 2024-06-26: 20 mg via INTRAVENOUS
  Filled 2024-06-26: qty 5

## 2024-06-26 MED ORDER — DILTIAZEM HCL 25 MG/5ML IV SOLN: 20.0000 mg | Freq: Once | INTRAVENOUS | Status: DC

## 2024-06-26 MED ORDER — ASPIRIN 81 MG PO TBEC
81.0000 mg | DELAYED_RELEASE_TABLET | Freq: Every day | ORAL | Status: DC
  Administered 2024-06-26: 81 mg via ORAL
  Filled 2024-06-26: qty 1

## 2024-06-26 MED ORDER — ETOMIDATE 2 MG/ML IV SOLN
0.1000 mg/kg | Freq: Once | INTRAVENOUS | Status: AC
  Administered 2024-06-26: 10 mg via INTRAVENOUS
  Filled 2024-06-26: qty 10

## 2024-06-26 NOTE — ED Notes (Signed)
Called lab to add on TSH

## 2024-06-26 NOTE — Progress Notes (Signed)
 RT NOTE: RT on standby for cardioversion.

## 2024-06-26 NOTE — ED Notes (Signed)
Informed RN of monitor alerts ?

## 2024-06-26 NOTE — ED Notes (Signed)
 Blue, Gold and DG tube also drawn

## 2024-06-26 NOTE — ED Provider Notes (Incomplete)
 James Moses Provider Note   CSN: [id number] Arrival date & time: 06/26/24  1653     Patient presents with: Chest Pain (Has hx of Afib and has medications he was given but didn't take them.  He was told to come to ED if  happens again/Feels like his heart is fluttering/Pt is well appearing and shows no signs of distress.)   James Moses is a 23 y.o. male with a past medical history of paroxysmal atrial fibrillation, previously followed at [facility] pediatric cardiology with a questionable history of hypertrophic cardiomyopathy who presents for racing heart and palpitations.  He had sudden onset of the symptoms starting at 3:40 PM.  This was about an hour and 25 minutes prior to my assessment.  He had sensation of pressure on his chest with feeling of racing heart and occasional palpitations.  Patient previously was seen in the emergency department and given rate control medications and had a cardioversion.  He does not take any anticoagulants and previous CHA2DS2-VASc score was 0.  As per review of EMR and patient medications at bedside patient was instructed to take flecainide  and metoprolol  at onset of symptoms but under the supervision of ED staff so presented here.  He denies chest pain, shortness of breath diaphoresis or nausea and denies any feelings of near syncope.  {Add pertinent medical, surgical, social history, OB history to YEP:67052} The history is provided by the patient.  Palpitations Palpitations quality:  Fast Onset quality:  Sudden      Prior to Admission medications   Medication Sig Start Date End Date Taking? Authorizing Provider  flecainide  (TAMBOCOR ) 150 MG tablet Take 2 tablets (300 mg total) by mouth daily as needed. 01/06/24   Kennyth Chew, MD  ibuprofen  (ADVIL ) 800 MG tablet Take 1 tablet (800 mg total) by mouth 3 (three) times daily. 09/09/20   Wieters, Hallie C, PA-C  metoprolol  succinate (TOPROL  XL) 25 MG 24 hr tablet  Take 1 tablet (25 mg total) by mouth as needed. Take 1 tablet as needed with flecainide  01/06/24   Kennyth Chew, MD  terbinafine  (LAMISIL ) 250 MG tablet Take 1 tablet (250 mg total) by mouth daily. 08/23/23   Tobie Franky SQUIBB, DPM    Allergies: Patient has no known allergies.    Review of Systems  Cardiovascular:  Positive for palpitations.    Updated Vital Signs BP (!) 179/157   Pulse 91   Temp 98.4 F (36.9 C)   Resp 18   Wt 101 kg   SpO2 96%   BMI 31.95 kg/m   Physical Exam Vitals and nursing note reviewed.  Constitutional:      General: He is not in acute distress.    Appearance: He is well-developed. He is not diaphoretic.  HENT:     Head: Normocephalic and atraumatic.  Eyes:     General: No scleral icterus.    Conjunctiva/sclera: Conjunctivae normal.  Cardiovascular:     Rate and Rhythm: Tachycardia present. Rhythm irregularly irregular.     Heart sounds: Normal heart sounds.  Pulmonary:     Effort: Pulmonary effort is normal. No respiratory distress.     Breath sounds: Normal breath sounds.  Abdominal:     Palpations: Abdomen is soft.     Tenderness: There is no abdominal tenderness.  Musculoskeletal:     Cervical back: Normal range of motion and neck supple.  Skin:    General: Skin is warm and dry.  Neurological:  Mental Status: He is alert.  Psychiatric:        Behavior: Behavior normal.     (all labs ordered are listed, but only abnormal results are displayed) Labs Reviewed  CBC - Abnormal; Notable for the following components:      Result Value   RBC 5.84 (*)    MCV 79.8 (*)    All other components within normal limits  BASIC METABOLIC PANEL WITH GFR  MAGNESIUM  TSH  TROPONIN I (HIGH SENSITIVITY)    EKG: EKG Interpretation Date/Time:  Tuesday June 26 2024 17:15:43 EDT Ventricular Rate:  123 PR Interval:    QRS Duration:  88 QT Interval:  319 QTC Calculation: 457 R Axis:   107  Text Interpretation: Atrial fibrillation Consider right  ventricular hypertrophy Confirmed by Ula Barter [phone]) on 06/26/2024 5:19:50 PM  Radiology: No results found.  {Document cardiac monitor, telemetry assessment procedure when appropriate:32947} .Cardioversion  Date/Time: 06/26/2024 9:10 PM  Performed by: Arloa Chroman, PA-C Authorized by: Arloa Chroman, PA-C   Consent:    Consent obtained:  Written   Consent given by:  Patient   Risks discussed:  Cutaneous burn, death, induced arrhythmia and pain   Alternatives discussed:  No treatment, rate-control medication and referral Pre-procedure details:    Cardioversion basis:  Elective   Rhythm:  Atrial fibrillation   Electrode placement:  Anterior-posterior Attempt one:    Cardioversion mode:  Synchronous   Shock (Joules):  150   Shock outcome:  Conversion to normal sinus rhythm Post-procedure details:    Patient status:  Awake   Patient tolerance of procedure:  Tolerated well, no immediate complications    Medications Ordered in the ED - No data to display   Clinical Course as of 06/26/24 2110  Tue Jun 26, 2024  1815 As instructed by cardiology patient took a dose of his flecainide  300 mg and his oral metoprolol  25 mg.  Blood pressure did come down however heart rate still at 134.  Will try IV diltiazem. [AH]  1819 Basic metabolic panel(!) [AH]  1819 CBC(!) [AH]  1819 Troponin I (High Sensitivity) BMP CBC and troponin without specific abnormality [AH]  1820 Magnesium Magnesium within normal limits [AH]  1820 DG Chest Port 1 View [AH]  1820 I visualized and interpreted 1 view chest x-ray which shows no acute findings, no obvious signs of cardiomegaly [AH]  1820 EKG 12-Lead EKG shows atrial fibrillation at a rate of 123 wet read at bedside upon initial eval evaluation of the patient [AH]  1922 Case discussed with Dr. Francyne who recommends cardioversion [AH]    Clinical Course User Index [AH] Arloa Chroman, PA-C   {Click here for ABCD2, HEART and other calculators  REFRESH Note before signing:1}      CHA2DS2-VASc Score: 0                        Patient BP elevated today in the ED last Cardiology visit 12/2023 130/78 - questionable HTN which would give patient a score of 1 Medical Decision Making Amount and/or Complexity of Data Reviewed Labs: ordered. Decision-making details documented in ED Course. Radiology:  Decision-making details documented in ED Course. ECG/medicine tests:  Decision-making details documented in ED Course.  Risk OTC drugs. Prescription drug management.   This patient presents to the ED with chief complaint(s) of palpitations and racing heart with pertinent past medical history of paroxysmal atrial fibrillation questionable hypertrophic cardiomyopathy which further complicates the presenting complaint. The complaint  involves an extensive differential diagnosis and treatment options and also carries with it a high risk of complications and morbidity.    The differential diagnosis for palpitations includes cardiac arrhythmias, PVC/PAC, ACS, Cardiomyopathy, CHF, MVP, pericarditis, valvular disease, Panic/Anxiety, Somatic disorder, ETOH, Caffeine,  Stimulant use, medication side effect, Anemia, Hyperthyroidism, pulmonary embolism.   The initial plan is to order cardiac monitoring, basic labs, troponin, TSH and to treat patient with oral flecainide  and metoprolol  for atrial fibrillation with rapid ventricular response  Additional Tests and treatment considered: Patient is low risk / negative by ***, therefore do not feel that *** is indicated.  Additional history obtained: Additional history obtained from {additional history:26846} Records reviewed {records:26847}  Reassessment and review (also see workup area): Lab Tests: I Ordered, and personally interpreted labs.  The pertinent results include:  ***  Imaging Studies: I ordered and independently visualized and interpreted the following imaging {imaging:26848}  which showed  *** The interpretation of the imaging was limited to assessing for emergent pathology, for which purpose it was ordered.  Consultations Obtained: I requested consultation with the {consultation:26851}, and discussed  findings as well as pertinent plan - they recommend: ***  Medicines ordered and prescription drug management: I ordered the following medications *** for ***  I considered this additional medications: ***   Reevaluation of the patient after these medicines showed that the patient    {resolved/improved/worsened:23923::improved}  Social Determinants of Health: Patient's {DINY:73154}  increases the complexity of managing their presentation  Cardiac Monitoring: The patient was maintained on a cardiac monitor.  I personally viewed and interpreted the cardiac monitor which showed an underlying rhythm of:  {cardiac monitor:26849}  Complexity of problems addressed: Patient's presentation is most consistent with  {COPA:26843} During patient's assessment  Disposition: After consideration of the diagnostic results and the patient's response to treatment,  I feel that the patent would benefit from {disposition:26850}.    {Document critical care time when appropriate  Document review of labs and clinical decision tools ie CHADS2VASC2, etc  Document your independent review of radiology images and any outside records  Document your discussion with family members, caretakers and with consultants  Document social determinants of health affecting pt's care  Document your decision making why or why not admission, treatments were needed:32947:::1}   Final diagnoses:  None    ED Discharge Orders          Ordered    Amb referral to AFIB Clinic        06/26/24 1722

## 2024-06-26 NOTE — Discharge Instructions (Signed)
 Contact a health care provider if: You notice a change in the rate, rhythm, or strength of your heartbeat. You are taking a blood thinner and you notice more bruising. You tire more easily when you exercise or do heavy work. You have a sudden change in weight. Get help right away if:  You have chest pain. You have trouble breathing. You have side effects of blood thinners, such as blood in your vomit, poop (stool), or pee (urine), or bleeding that does not stop. You have any symptoms of a stroke. BE FAST is an easy way to remember the main warning signs of a stroke: B - Balance. Signs are dizziness, sudden trouble walking, or loss of balance. E - Eyes. Signs are trouble seeing or a sudden change in vision. F - Face. Signs are sudden weakness or numbness of the face, or the face or eyelid drooping on one side. A - Arms. Signs are weakness or numbness in an arm. This happens suddenly and usually on one side of the body. S - Speech.Signs are sudden trouble speaking, slurred speech, or trouble understanding what people say. T - Time. Time to call emergency services. Write down what time symptoms started. Other signs of a stroke, such as: A sudden, severe headache with no known cause. Nausea or vomiting. Seizure. These symptoms may be an emergency. Get help right away. Call 911. Do not wait to see if the symptoms will go away. Do not drive yourself to the hospital. This information is not intended to replace advice given to you by your health care provider. Make sure you discuss any questions you have with your health care provider.

## 2024-06-26 NOTE — ED Provider Notes (Signed)
 Physical Exam  BP 117/71   Pulse 85   Temp 98.4 F (36.9 C)   Resp 17   Wt 101 kg   SpO2 100%   BMI 31.95 kg/m   Physical Exam  Procedures  .Sedation  Date/Time: 06/26/2024 8:50 PM  Performed by: Ula Prentice SAUNDERS, MD Authorized by: Ula Prentice SAUNDERS, MD   Consent:    Consent obtained:  Written   Consent given by:  Patient   Risks discussed:  Allergic reaction, dysrhythmia, prolonged sedation necessitating reversal, prolonged hypoxia resulting in organ damage, respiratory compromise necessitating ventilatory assistance and intubation, inadequate sedation, nausea and vomiting   Alternatives discussed:  Analgesia without sedation Universal protocol:    Immediately prior to procedure, a time out was called: yes   Indications:    Procedure performed:  Cardioversion Pre-sedation assessment:    Time since last food or drink:  1300   ASA classification: class 2 - patient with mild systemic disease     Mallampati score:  I - soft palate, uvula, fauces, pillars visible   Pre-sedation assessments completed and reviewed: pre-procedure airway patency not reviewed, pre-procedure cardiovascular function not reviewed, pre-procedure hydration status not reviewed, pre-procedure mental status not reviewed, pre-procedure nausea and vomiting status not reviewed, pre-procedure pain level not reviewed, pre-procedure respiratory function not reviewed and pre-procedure temperature not reviewed   A pre-sedation assessment was completed prior to the start of the procedure Immediate pre-procedure details:    Reassessment: Patient reassessed immediately prior to procedure     Reviewed: vital signs, relevant labs/tests and NPO status     Verified: bag valve mask available, emergency equipment available, intubation equipment available, IV patency confirmed, oxygen available, reversal medications available and suction available   Procedure details (see MAR for exact dosages):    Preoxygenation:  Nasal cannula    Sedation:  Etomidate    Intended level of sedation: deep   Intra-procedure monitoring:  Blood pressure monitoring, continuous capnometry, frequent LOC assessments, frequent vital sign checks, continuous pulse oximetry and cardiac monitor   Intra-procedure events: none     Total Provider sedation time (minutes):  15 Post-procedure details:   A post-sedation assessment was completed following the completion of the procedure.   ED Course / MDM   Clinical Course as of 06/26/24 2049  Tue Jun 26, 2024  1815 As instructed by cardiology patient took a dose of his flecainide  300 mg and his oral metoprolol  25 mg.  Blood pressure did come down however heart rate still at 134.  Will try IV diltiazem. [AH]  1819 Basic metabolic panel(!) [AH]  1819 CBC(!) [AH]  1819 Troponin I (High Sensitivity) BMP CBC and troponin without specific abnormality [AH]  1820 Magnesium Magnesium within normal limits [AH]  1820 DG Chest Port 1 View [AH]  1820 I visualized and interpreted 1 view chest x-ray which shows no acute findings, no obvious signs of cardiomegaly [AH]  1820 EKG 12-Lead EKG shows atrial fibrillation at a rate of 123 wet read at bedside upon initial eval evaluation of the patient [AH]  1922 Case discussed with Dr. Francyne who recommends cardioversion [AH]    Clinical Course User Index [AH] Arloa Chroman, PA-C   Medical Decision Making Amount and/or Complexity of Data Reviewed Labs: ordered. Decision-making details documented in ED Course. Radiology:  Decision-making details documented in ED Course. ECG/medicine tests:  Decision-making details documented in ED Course.  Risk OTC drugs. Prescription drug management.   The patient was sedated for cardioversion due to persistent atrial fibrillation despite  antiarrhythmics after discussion with cardiology.       Ula Prentice SAUNDERS, MD 06/26/24 2052

## 2024-06-26 NOTE — Sedation Documentation (Signed)
 Shocked at 120 J

## 2024-06-26 NOTE — ED Triage Notes (Signed)
 Pt complains of chest discomfort- hx of AFIB. Pt first felt heart start to palpitate a few hours ago.

## 2024-06-26 NOTE — ED Provider Notes (Signed)
 Startup EMERGENCY DEPARTMENT AT [facility] Provider Note   CSN: [id number] Arrival date & time: 06/26/24  1653     Patient presents with: Chest Pain (Has hx of Afib and has medications he was given but didn't take them.  He was told to come to ED if  happens again/Feels like his heart is fluttering/Pt is well appearing and shows no signs of distress.)   James Moses is a 23 y.o. male with a past medical history of paroxysmal atrial fibrillation, previously followed at [facility] pediatric cardiology with a questionable history of hypertrophic cardiomyopathy who presents for racing heart and palpitations.  He had sudden onset of the symptoms starting at 3:40 PM.  This was about an hour and 25 minutes prior to my assessment.  He had sensation of pressure on his chest with feeling of racing heart and occasional palpitations.  Patient previously was seen in the emergency department and given rate control medications and had a cardioversion.  He does not take any anticoagulants and previous CHA2DS2-VASc score was 0.  As per review of EMR and patient medications at bedside patient was instructed to take flecainide  and metoprolol  at onset of symptoms but under the supervision of ED staff so presented here.  He denies chest pain, shortness of breath diaphoresis or nausea and denies any feelings of near syncope.  {Add pertinent medical, surgical, social history, OB history to YEP:67052} The history is provided by the patient.  Palpitations Palpitations quality:  Fast Onset quality:  Sudden      Prior to Admission medications   Medication Sig Start Date End Date Taking? Authorizing Provider  flecainide  (TAMBOCOR ) 150 MG tablet Take 2 tablets (300 mg total) by mouth daily as needed. 01/06/24   Kennyth Chew, MD  ibuprofen  (ADVIL ) 800 MG tablet Take 1 tablet (800 mg total) by mouth 3 (three) times daily. 09/09/20   Wieters, Hallie C, PA-C  metoprolol  succinate (TOPROL  XL) 25 MG 24 hr tablet  Take 1 tablet (25 mg total) by mouth as needed. Take 1 tablet as needed with flecainide  01/06/24   Kennyth Chew, MD  terbinafine  (LAMISIL ) 250 MG tablet Take 1 tablet (250 mg total) by mouth daily. 08/23/23   Tobie Franky SQUIBB, DPM    Allergies: Patient has no known allergies.    Review of Systems  Cardiovascular:  Positive for palpitations.    Updated Vital Signs BP (!) 179/157   Pulse 91   Temp 98.4 F (36.9 C)   Resp 18   Wt 101 kg   SpO2 96%   BMI 31.95 kg/m   Physical Exam Vitals and nursing note reviewed.  Constitutional:      General: He is not in acute distress.    Appearance: He is well-developed. He is not diaphoretic.  HENT:     Head: Normocephalic and atraumatic.   Eyes:     General: No scleral icterus.    Conjunctiva/sclera: Conjunctivae normal.    Cardiovascular:     Rate and Rhythm: Tachycardia present. Rhythm irregularly irregular.     Heart sounds: Normal heart sounds.  Pulmonary:     Effort: Pulmonary effort is normal. No respiratory distress.     Breath sounds: Normal breath sounds.  Abdominal:     Palpations: Abdomen is soft.     Tenderness: There is no abdominal tenderness.   Musculoskeletal:     Cervical back: Normal range of motion and neck supple.   Skin:    General: Skin is warm and  dry.   Neurological:     Mental Status: He is alert.   Psychiatric:        Behavior: Behavior normal.     (all labs ordered are listed, but only abnormal results are displayed) Labs Reviewed  CBC - Abnormal; Notable for the following components:      Result Value   RBC 5.84 (*)    MCV 79.8 (*)    All other components within normal limits  BASIC METABOLIC PANEL WITH GFR  MAGNESIUM  TSH  TROPONIN I (HIGH SENSITIVITY)    EKG: EKG Interpretation Date/Time:  Tuesday June 26 2024 17:15:43 EDT Ventricular Rate:  123 PR Interval:    QRS Duration:  88 QT Interval:  319 QTC Calculation: 457 R Axis:   107  Text Interpretation: Atrial fibrillation  Consider right ventricular hypertrophy Confirmed by Ula Barter [phone]) on 06/26/2024 5:19:50 PM  Radiology: No results found.  {Document cardiac monitor, telemetry assessment procedure when appropriate:32947} Procedures   Medications Ordered in the ED - No data to display   Clinical Course as of 06/26/24 1820  Tue Jun 26, 2024  1815 As instructed by cardiology patient took a dose of his flecainide  300 mg and his oral metoprolol  25 mg.  Blood pressure did come down however heart rate still at 134.  Will try IV diltiazem. [AH]  1819 Basic metabolic panel(!) [AH]  1819 CBC(!) [AH]  1819 Troponin I (High Sensitivity) BMP CBC and troponin without specific abnormality [AH]  1820 Magnesium Magnesium within normal limits [AH]  1820 DG Chest Port 1 View [AH]  1820 I visualized and interpreted 1 view chest x-ray which shows no acute findings, no obvious signs of cardiomegaly [AH]  1820 EKG 12-Lead EKG shows atrial fibrillation at a rate of 123 wet read at bedside upon initial eval evaluation of the patient [AH]    Clinical Course User Index [AH] Arloa Chroman, PA-C   {Click here for ABCD2, HEART and other calculators REFRESH Note before signing:1}      CHA2DS2-VASc Score: 0                        Patient BP elevated today in the ED last Cardiology visit 12/2023 130/78 - questionable HTN which would give patient a score of 1 Medical Decision Making Amount and/or Complexity of Data Reviewed Labs: ordered.  Risk Prescription drug management.   This patient presents to the ED with chief complaint(s) of palpitations and racing heart with pertinent past medical history of paroxysmal atrial fibrillation questionable hypertrophic cardiomyopathy which further complicates the presenting complaint. The complaint involves an extensive differential diagnosis and treatment options and also carries with it a high risk of complications and morbidity.    The differential diagnosis for palpitations  includes cardiac arrhythmias, PVC/PAC, ACS, Cardiomyopathy, CHF, MVP, pericarditis, valvular disease, Panic/Anxiety, Somatic disorder, ETOH, Caffeine,  Stimulant use, medication side effect, Anemia, Hyperthyroidism, pulmonary embolism.   The initial plan is to order cardiac monitoring, basic labs, troponin, TSH and to treat patient with oral flecainide  and metoprolol  for atrial fibrillation with rapid ventricular response  Additional Tests and treatment considered: Patient is low risk / negative by ***, therefore do not feel that *** is indicated.  Additional history obtained: Additional history obtained from {additional history:26846} Records reviewed {records:26847}  Reassessment and review (also see workup area): Lab Tests: I Ordered, and personally interpreted labs.  The pertinent results include:  ***  Imaging Studies: I ordered and independently visualized and  interpreted the following imaging {imaging:26848}  which showed *** The interpretation of the imaging was limited to assessing for emergent pathology, for which purpose it was ordered.  Consultations Obtained: I requested consultation with the {consultation:26851}, and discussed  findings as well as pertinent plan - they recommend: ***  Medicines ordered and prescription drug management: I ordered the following medications *** for ***  I considered this additional medications: ***   Reevaluation of the patient after these medicines showed that the patient    {resolved/improved/worsened:23923::improved}  Social Determinants of Health: Patient's {DINY:73154}  increases the complexity of managing their presentation  Cardiac Monitoring: The patient was maintained on a cardiac monitor.  I personally viewed and interpreted the cardiac monitor which showed an underlying rhythm of:  {cardiac monitor:26849}  Complexity of problems addressed: Patient's presentation is most consistent with  {COPA:26843} During patient's  assessment  Disposition: After consideration of the diagnostic results and the patient's response to treatment,  I feel that the patent would benefit from {disposition:26850}.    {Document critical care time when appropriate  Document review of labs and clinical decision tools ie CHADS2VASC2, etc  Document your independent review of radiology images and any outside records  Document your discussion with family members, caretakers and with consultants  Document social determinants of health affecting pt's care  Document your decision making why or why not admission, treatments were needed:32947:::1}   Final diagnoses:  None    ED Discharge Orders          Ordered    Amb referral to AFIB Clinic        06/26/24 1722

## 2024-06-26 NOTE — ED Notes (Signed)
 Pt tolerated PO challenge

## 2024-06-26 NOTE — ED Notes (Signed)
 Pt given water and fluids to PO challenge.

## 2024-06-26 NOTE — ED Notes (Signed)
 Patients home medication, Flecainide  300mg  and metoprolol  25mg  given per  EDP

## 2024-07-12 ENCOUNTER — Ambulatory Visit (HOSPITAL_COMMUNITY)
Admission: RE | Admit: 2024-07-12 | Discharge: 2024-07-12 | Disposition: A | Discharge status: Home / Self Care | Payer: Self-pay | Source: Ambulatory Visit | Attending: Internal Medicine | Admitting: Internal Medicine

## 2024-07-12 VITALS — BP 122/76 | HR 67 | Ht 70.0 in | Wt 237.2 lb

## 2024-07-12 DIAGNOSIS — I480 Paroxysmal atrial fibrillation: Secondary | ICD-10-CM

## 2024-07-12 DIAGNOSIS — I4891 Unspecified atrial fibrillation: Secondary | ICD-10-CM

## 2024-07-12 NOTE — Progress Notes (Signed)
 Primary Care Physician: Ilah Crigler, MD Primary Cardiologist: Jerel Balding, MD Electrophysiologist: Fonda Kitty, MD     Referring Physician: ED     James Moses is a 23 y.o. male with a history of atrial fibrillation who presents for consultation in the [facility]. Flecainide  pill in pocket strategy by Dr. Kitty last seen 01/06/24. ED visit on 06/26/24 for Afib with RVR; patient did flecainide  pill in pocket strategy with metoprolol  XL 25 mg but did not convert. He underwent successful cardioversion in the ED. Patient has a CHADS2VASC score of zero.  On evaluation today, he is currently in NSR. He has now had a total of 4 sustained episodes of Afib. This is the first episode of Afib he has had this year. He wears an [employer] watch. He has been drinking an energy drink called Alani but just recently started this. He does snore.   Today, he denies symptoms of chest pain, shortness of breath, orthopnea, PND, lower extremity edema, dizziness, presyncope, syncope, snoring, daytime somnolence, bleeding, or neurologic sequela. The patient is tolerating medications without difficulties and is otherwise without complaint today.    he has a BMI of Body mass index is 34.03 kg/m.SABRA Filed Weights   07/12/24 0903  Weight: 107.6 kg    Current Outpatient Medications  Medication Sig Dispense Refill   flecainide  (TAMBOCOR ) 150 MG tablet Take 2 tablets (300 mg total) by mouth daily as needed. 30 tablet 3   ibuprofen  (ADVIL ) 800 MG tablet Take 1 tablet (800 mg total) by mouth 3 (three) times daily. (Patient taking differently: Take 800 mg by mouth as needed.) 21 tablet 0   metoprolol  succinate (TOPROL  XL) 25 MG 24 hr tablet Take 1 tablet (25 mg total) by mouth as needed. Take 1 tablet as needed with flecainide  90 tablet 3   No current facility-administered medications for this encounter.    Atrial Fibrillation Management history:  Previous antiarrhythmic drugs:  flecainide  pill in pocket Previous cardioversions: 11/2019, 06/26/24 Previous ablations: EP study 09/2019 [facility] Anticoagulation history:    ROS- All systems are reviewed and negative except as per the HPI above.  Physical Exam: BP 122/76   Pulse 67   Ht 5' 10 (1.778 m)   Wt 107.6 kg   BMI 34.03 kg/m   GEN: Well nourished, well developed in no acute distress NECK: No JVD; No carotid bruits CARDIAC: Regular rate and rhythm, no murmurs, rubs, gallops RESPIRATORY:  Clear to auscultation without rales, wheezing or rhonchi  ABDOMEN: Soft, non-tender, non-distended EXTREMITIES:  No edema; No deformity   EKG today demonstrates  Vent. rate 67 BPM PR interval 162 ms QRS duration 102 ms QT/QTcB 364/384 ms P-R-T axes 54 111 20 Normal sinus rhythm Right axis deviation Nonspecific T wave abnormality Abnormal ECG When compared with ECG of 26-Jun-2024 20:12, PREVIOUS ECG IS PRESENT  Echo 05/16/23 demonstrated  1. Left ventricular ejection fraction, by estimation, is 60 to 65%. The  left ventricle has normal function. The left ventricle has no regional  wall motion abnormalities. There is mild concentric left ventricular  hypertrophy. Left ventricular diastolic  parameters were normal. The average left ventricular global longitudinal  strain is -29.4 %. The global longitudinal strain is normal.   2. Right ventricular systolic function is normal. The right ventricular  size is normal.   3. The mitral valve is normal in structure. Trivial mitral valve  regurgitation. No evidence of mitral stenosis.   4. The aortic valve is tricuspid.  Aortic valve regurgitation is not  visualized. No aortic stenosis is present.   5. The inferior vena cava is normal in size with greater than 50%  respiratory variability, suggesting right atrial pressure of 3 mmHg.   ASSESSMENT & PLAN CHA2DS2-VASc Score = 0  The patient's score is based upon: CHF History: 0 HTN History: 0 Diabetes History: 0 Stroke  History: 0 Vascular Disease History: 0 Age Score: 0 Gender Score: 0       ASSESSMENT AND PLAN: Paroxysmal Atrial Fibrillation (ICD10:  I48.0) The patient's CHA2DS2-VASc score is 0, indicating a 0.2% annual risk of stroke.    He is currently in NSR. Continue pill in pocket strategy for flecainide  with metoprolol . We discussed possible triggers for Afib including caffeine, alcohol, sleep apnea, and weight gain. Patient will contact clinic in future once he has health insurance to likely proceed with sleep study and referral to healthy weight and wellness. I did recommend to patient to contact clinic if he has increase in Afib episodes; would likely set him up to see Dr. Kennyth to consider EP study / ablation.    Follow up Afib clinic 6 months.    Terra Pac, PA-C  Afib Clinic [facility] [address] [place], [place] [address] [phone]

## 2024-09-02 ENCOUNTER — Other Ambulatory Visit: Payer: Self-pay

## 2024-09-02 ENCOUNTER — Encounter (HOSPITAL_COMMUNITY): Payer: Self-pay | Admitting: *Deleted

## 2024-09-02 ENCOUNTER — Emergency Department (HOSPITAL_COMMUNITY)
Admission: EM | Admit: 2024-09-02 | Discharge: 2024-09-02 | Disposition: A | Discharge status: Home / Self Care | Payer: Self-pay | Attending: Emergency Medicine | Admitting: Emergency Medicine

## 2024-09-02 DIAGNOSIS — I480 Paroxysmal atrial fibrillation: Secondary | ICD-10-CM | POA: Insufficient documentation

## 2024-09-02 HISTORY — DX: Unspecified atrial fibrillation: I48.91

## 2024-09-02 LAB — CBC
HCT: 49.3 % (ref 39.0–52.0)
Hemoglobin: 16 g/dL (ref 13.0–17.0)
MCH: 25.5 pg — ABNORMAL LOW (ref 26.0–34.0)
MCHC: 32.5 g/dL (ref 30.0–36.0)
MCV: 78.5 fL — ABNORMAL LOW (ref 80.0–100.0)
Platelets: 272 K/uL (ref 150–400)
RBC: 6.28 MIL/uL — ABNORMAL HIGH (ref 4.22–5.81)
RDW: 13.8 % (ref 11.5–15.5)
WBC: 5.7 K/uL (ref 4.0–10.5)
nRBC: 0 % (ref 0.0–0.2)

## 2024-09-02 LAB — COMPREHENSIVE METABOLIC PANEL WITH GFR
ALT: 23 U/L (ref 0–44)
AST: 20 U/L (ref 15–41)
Albumin: 4.4 g/dL (ref 3.5–5.0)
Alkaline Phosphatase: 60 U/L (ref 38–126)
Anion gap: 12 (ref 5–15)
BUN: 9 mg/dL (ref 6–20)
CO2: 23 mmol/L (ref 22–32)
Calcium: 9.7 mg/dL (ref 8.9–10.3)
Chloride: 104 mmol/L (ref 98–111)
Creatinine, Ser: 1.01 mg/dL (ref 0.61–1.24)
GFR, Estimated: >60 mL/min (ref >60)
Glucose, Bld: 95 mg/dL (ref 70–99)
Potassium: 3.6 mmol/L (ref 3.5–5.1)
Sodium: 139 mmol/L (ref 135–145)
Total Bilirubin: 0.7 mg/dL (ref 0.0–1.2)
Total Protein: 7.5 g/dL (ref 6.5–8.1)

## 2024-09-02 LAB — I-STAT CHEM 8, ED
BUN: 8 mg/dL (ref 6–20)
Calcium, Ion: 1.24 mmol/L (ref 1.15–1.40)
Chloride: 104 mmol/L (ref 98–111)
Creatinine, Ser: 1.1 mg/dL (ref 0.61–1.24)
Glucose, Bld: 94 mg/dL (ref 70–99)
HCT: 50 % (ref 39.0–52.0)
Hemoglobin: 17 g/dL (ref 13.0–17.0)
Potassium: 3.6 mmol/L (ref 3.5–5.1)
Sodium: 142 mmol/L (ref 135–145)
TCO2: 24 mmol/L (ref 22–32)

## 2024-09-02 LAB — MAGNESIUM: Magnesium: 1.7 mg/dL (ref 1.7–2.4)

## 2024-09-02 LAB — LIPASE, BLOOD: Lipase: 35 U/L (ref 11–51)

## 2024-09-02 MED ORDER — SODIUM CHLORIDE 0.9 % IV BOLUS
1000.0000 mL | Freq: Once | INTRAVENOUS | Status: AC
  Administered 2024-09-02: 1000 mL via INTRAVENOUS

## 2024-09-02 MED ORDER — ETOMIDATE 2 MG/ML IV SOLN
10.0000 mg | Freq: Once | INTRAVENOUS | Status: AC
  Administered 2024-09-02: 10 mg via INTRAVENOUS
  Filled 2024-09-02: qty 10

## 2024-09-02 MED ORDER — METOPROLOL TARTRATE 5 MG/5ML IV SOLN
5.0000 mg | INTRAVENOUS | Status: DC | PRN
  Administered 2024-09-02: 5 mg via INTRAVENOUS
  Filled 2024-09-02: qty 5

## 2024-09-02 MED ORDER — SODIUM CHLORIDE 0.9 % IV SOLN: INTRAVENOUS | Status: DC

## 2024-09-02 NOTE — Sedation Documentation (Signed)
 120j shock for cardioversion

## 2024-09-02 NOTE — ED Triage Notes (Signed)
 Patient states he is here because he has Afib. No thinners.

## 2024-09-02 NOTE — ED Triage Notes (Addendum)
 The pt reports that he has had af since he was 23 years old  he has been cardioverted  several times  he has had this 2 times in the past year at present he is not sob no chest pain

## 2024-09-02 NOTE — ED Provider Notes (Signed)
 [place] EMERGENCY DEPARTMENT AT [facility] Provider Note  CSN: [id number] Arrival date & time: 09/02/24 0116  Chief Complaint(s) No chief complaint on file.  HPI James Moses is a 23 y.o. male with a past medical history listed below including paroxysmal atrial fibrillation with a CHA2DS2-VASc score of 0 not on anticoagulation, here for episode of atrial fibrillation that began around 12 to 12:30 AM.  Patient reports feeling palpitations when it comes on.  Confirming atrial fibrillation with his Kardia monitor.  She reported not taking his flecainide  or metoprolol  this time as it did not work the previous time.  He denies any recent fevers or infections.  No coughing or congestion.  No nausea or vomiting.  No diarrhea.  No current chest pain and shortness of breath.  No illicit drug use.  No alcohol use.  The history is provided by the patient.    Past Medical History Past Medical History:  Diagnosis Date   AF (atrial fibrillation) (HCC)    Headache(784.0)    Patient Active Problem List   Diagnosis Date Noted   Migraines 08/03/2023   Need for vaccination 10/03/2020   Well child check 10/03/2020   Atrial fibrillation (HCC) 08/30/2019   Chest pain 09/03/2013   Palpitations 09/03/2013   Migraine without aura 08/29/2013   Tension type headache 08/29/2013   Home Medication(s) Prior to Admission medications   Medication Sig Start Date End Date Taking? Authorizing Provider  flecainide  (TAMBOCOR ) 150 MG tablet Take 2 tablets (300 mg total) by mouth daily as needed. 01/06/24   Kennyth Chew, MD  ibuprofen  (ADVIL ) 800 MG tablet Take 1 tablet (800 mg total) by mouth 3 (three) times daily. Patient taking differently: Take 800 mg by mouth as needed. 09/09/20   Wieters, Hallie C, PA-C  metoprolol  succinate (TOPROL  XL) 25 MG 24 hr tablet Take 1 tablet (25 mg total) by mouth as needed. Take 1 tablet as needed with flecainide  01/06/24   Kennyth Chew, MD                                                                                                                                     Allergies Patient has no known allergies.  Review of Systems Review of Systems As noted in HPI  Physical Exam Vital Signs  I have reviewed the triage vital signs BP 121/80   Pulse 78   Temp 98.2 F (36.8 C) (Oral)   Resp 17   Ht 5' 10 (1.778 m)   Wt 107.6 kg   SpO2 98%   BMI 34.04 kg/m   Physical Exam Vitals reviewed.  Constitutional:      General: He is not in acute distress.    Appearance: He is well-developed. He is not diaphoretic.  HENT:     Head: Normocephalic and atraumatic.     Nose: Nose normal.  Eyes:     General: No scleral icterus.  Right eye: No discharge.        Left eye: No discharge.     Conjunctiva/sclera: Conjunctivae normal.     Pupils: Pupils are equal, round, and reactive to light.  Cardiovascular:     Rate and Rhythm: Tachycardia present. Rhythm irregularly irregular.     Heart sounds: No murmur heard.    No friction rub. No gallop.  Pulmonary:     Effort: Pulmonary effort is normal. No respiratory distress.     Breath sounds: Normal breath sounds. No stridor. No rales.  Abdominal:     General: There is no distension.     Palpations: Abdomen is soft.     Tenderness: There is no abdominal tenderness.  Musculoskeletal:        General: No tenderness.     Cervical back: Normal range of motion and neck supple.  Skin:    General: Skin is warm and dry.     Findings: No erythema or rash.  Neurological:     Mental Status: He is alert and oriented to person, place, and time.     ED Results and Treatments Labs (all labs ordered are listed, but only abnormal results are displayed) Labs Reviewed  CBC - Abnormal; Notable for the following components:      Result Value   RBC 6.28 (*)    MCV 78.5 (*)    MCH 25.5 (*)    All other components within normal limits  LIPASE, BLOOD  COMPREHENSIVE METABOLIC PANEL WITH GFR  MAGNESIUM   I-STAT CHEM 8, ED                                                                                                                         EKG  EKG Interpretation Date/Time:  Sunday September 02 2024 01:46:47 EDT Ventricular Rate:  116 PR Interval:    QRS Duration:  101 QT Interval:  348 QTC Calculation: 463 R Axis:   109  Text Interpretation: Atrial fibrillation Ventricular premature complex Borderline right axis deviation Minimal ST depression, inferior leads Borderline ST elevation, anterolateral leads Baseline wander in lead(s) II III aVF Confirmed by Trine Likes [phone]) on 09/02/2024 2:03:09 AM          EKG Interpretation Date/Time:  Sunday September 02 2024 03:33:05 EDT Ventricular Rate:  84 PR Interval:  194 QRS Duration:  100 QT Interval:  348 QTC Calculation: 412 R Axis:   120  Text Interpretation: Sinus rhythm Biatrial enlargement Right axis deviation Confirmed by Trine Likes [phone]) on 09/02/2024 4:43:34 AM       Radiology No results found.  Medications Ordered in ED Medications  sodium chloride  0.9 % bolus 1,000 mL (0 mLs Intravenous Stopped 09/02/24 0258)    And  0.9 %  sodium chloride  infusion (0 mLs Intravenous Hold 09/02/24 0223)  metoprolol  tartrate (LOPRESSOR ) injection 5 mg (5 mg Intravenous Given 09/02/24 0207)  etomidate  (AMIDATE ) injection 10 mg (10 mg Intravenous Given 09/02/24 0329)  sodium chloride  0.9 %  bolus 1,000 mL (1,000 mLs Intravenous New Bag/Given 09/02/24 0325)   Procedures .Cardioversion  Date/Time: 09/02/2024 4:45 AM  Performed by: Trine Raynell Moder, MD Authorized by: Trine Raynell Moder, MD   Consent:    Consent obtained:  Written   Consent given by:  Patient   Risks discussed:  Cutaneous burn, induced arrhythmia, pain and death   Alternatives discussed:  Rate-control medication and alternative treatment Pre-procedure details:    Cardioversion basis:  Emergent   Rhythm:  Atrial fibrillation   Electrode placement:   Anterior-posterior Patient sedated: Yes. Refer to sedation procedure documentation for details of sedation.  Attempt one:    Cardioversion mode:  Synchronous   Waveform:  Biphasic   Shock (Joules):  120   Shock outcome:  Conversion to normal sinus rhythm Post-procedure details:    Patient status:  Alert   Patient tolerance of procedure:  Tolerated well, no immediate complications .Sedation  Date/Time: 09/02/2024 4:45 AM  Performed by: Trine Raynell Moder, MD Authorized by: Trine Raynell Moder, MD   Consent:    Consent obtained:  Written   Consent given by:  Patient   Risks discussed:  Allergic reaction, prolonged hypoxia resulting in organ damage, prolonged sedation necessitating reversal and respiratory compromise necessitating ventilatory assistance and intubation Universal protocol:    Immediately prior to procedure, a time out was called: yes   Indications:    Procedure performed:  Cardioversion Pre-sedation assessment:    Time since last food or drink:  1800   ASA classification: class 2 - patient with mild systemic disease     Mallampati score:  II - soft palate, uvula, fauces visible   Pre-sedation assessments completed and reviewed: airway patency, cardiovascular function, hydration status, mental status, nausea/vomiting, pain level, respiratory function and temperature   A pre-sedation assessment was completed prior to the start of the procedure Immediate pre-procedure details:    Reassessment: Patient reassessed immediately prior to procedure     Reviewed: vital signs and relevant labs/tests     Verified: bag valve mask available, emergency equipment available, intubation equipment available, IV patency confirmed and oxygen available   Procedure details (see MAR for exact dosages):    Preoxygenation:  Nasal cannula   Sedation:  Etomidate    Intended level of sedation: deep   Total Provider sedation time (minutes):  11 Post-procedure details:   A post-sedation  assessment was completed following the completion of the procedure.   Post-sedation assessments completed and reviewed: airway patency, cardiovascular function, hydration status, mental status, nausea/vomiting, respiratory function and temperature     Patient is stable for discharge or admission: yes     Procedure completion:  Tolerated well, no immediate complications .Critical Care  Performed by: Trine Raynell Moder, MD Authorized by: Trine Raynell Moder, MD   Critical care provider statement:    Critical care time (minutes):  45   Critical care time was exclusive of:  Separately billable procedures and treating other patients   Critical care was necessary to treat or prevent imminent or life-threatening deterioration of the following conditions:  Cardiac failure   Critical care was time spent personally by me on the following activities:  Development of treatment plan with patient or surrogate, discussions with consultants, evaluation of patient's response to treatment, examination of patient, obtaining history from patient or surrogate, review of old charts, re-evaluation of patient's condition, pulse oximetry, ordering and review of radiographic studies, ordering and review of laboratory studies and ordering and performing treatments and interventions   (including critical  care time) Medical Decision Making / ED Course   Medical Decision Making Amount and/or Complexity of Data Reviewed Labs: ordered. Decision-making details documented in ED Course. ECG/medicine tests: ordered and independent interpretation performed. Decision-making details documented in ED Course.  Risk Prescription drug management.     Clinical Course as of 09/02/24 0448  [place] Sep 02, 2024  0140 Patient presents in A-fib RVR. Currently hemodynamically stable.  Rates range from low 100s to 150s.  Will get labs to assess for any electrolyte abnormalities needing correcting.  Will provide IV fluids and give  a dose of metoprolol . [PC]  0310 Labs are reassuring without anemia or electrolyte derangements.  After IV fluid bolus and metoprolol , patient's heart rate did improve with rates in the 60s to 80s but still in atrial fibrillation.  Discussed taking flecainide  versus cardioversion and patient opted for cardioversion.  Discussed this with Dr. Cesario who is the cardiology fellow on-call who agreed with moving forward with cardioversion. [PC]  9660 Cardioversion was successful [PC]  0442 HDS [PC]    Clinical Course User Index [PC] Scotlynn Noyes, Raynell Moder, MD    Final Clinical Impression(s) / ED Diagnoses Final diagnoses:  Paroxysmal atrial fibrillation with RVR (HCC)  Paroxysmal atrial fibrillation (HCC)   The patient appears reasonably screened and/or stabilized for discharge and I doubt any other medical condition or other [facility] requiring further screening, evaluation, or treatment in the ED at this time. I have discussed the findings, Dx and Tx plan with the patient/family who expressed understanding and agree(s) with the plan. Discharge instructions discussed at length. The patient/family was given strict return precautions who verbalized understanding of the instructions. No further questions at time of discharge.  Disposition: Discharge  Condition: Good  ED Discharge Orders          Ordered    Ambulatory referral to Cardiology       Comments: If you have not heard from the Cardiology office within the next 72 hours please call [phone].   09/02/24 0445              Follow Up: Ilah Crigler, MD [address] [place] [address] [phone]  Call  to schedule an appointment for close follow up    This chart was dictated using voice recognition software.  Despite best efforts to proofread,  errors can occur which can change the documentation meaning.    Trine Raynell Moder, MD 09/02/24 [phone]

## 2024-09-02 NOTE — ED Notes (Signed)
Pt is now eating and drinking.

## 2024-10-27 ENCOUNTER — Emergency Department (HOSPITAL_BASED_OUTPATIENT_CLINIC_OR_DEPARTMENT_OTHER): Payer: Self-pay

## 2024-10-27 ENCOUNTER — Other Ambulatory Visit: Payer: Self-pay

## 2024-10-27 ENCOUNTER — Emergency Department (HOSPITAL_BASED_OUTPATIENT_CLINIC_OR_DEPARTMENT_OTHER)
Admission: EM | Admit: 2024-10-27 | Discharge: 2024-10-27 | Disposition: A | Discharge status: Home / Self Care | Payer: Self-pay | Attending: Emergency Medicine | Admitting: Emergency Medicine

## 2024-10-27 ENCOUNTER — Encounter (HOSPITAL_BASED_OUTPATIENT_CLINIC_OR_DEPARTMENT_OTHER): Payer: Self-pay | Admitting: Emergency Medicine

## 2024-10-27 DIAGNOSIS — I480 Paroxysmal atrial fibrillation: Secondary | ICD-10-CM | POA: Insufficient documentation

## 2024-10-27 DIAGNOSIS — Z79899 Other long term (current) drug therapy: Secondary | ICD-10-CM | POA: Insufficient documentation

## 2024-10-27 LAB — MAGNESIUM: Magnesium: 1.9 mg/dL (ref 1.7–2.4)

## 2024-10-27 LAB — CBC WITH DIFFERENTIAL/PLATELET
Abs Immature Granulocytes: 0.02 K/uL (ref 0.00–0.07)
Basophils Absolute: 0 K/uL (ref 0.0–0.1)
Basophils Relative: 1 %
Eosinophils Absolute: 0.2 K/uL (ref 0.0–0.5)
Eosinophils Relative: 3 %
HCT: 48.2 % (ref 39.0–52.0)
Hemoglobin: 15.8 g/dL (ref 13.0–17.0)
Immature Granulocytes: 0 %
Lymphocytes Relative: 38 %
Lymphs Abs: 2.1 K/uL (ref 0.7–4.0)
MCH: 25.4 pg — ABNORMAL LOW (ref 26.0–34.0)
MCHC: 32.8 g/dL (ref 30.0–36.0)
MCV: 77.5 fL — ABNORMAL LOW (ref 80.0–100.0)
Monocytes Absolute: 0.6 K/uL (ref 0.1–1.0)
Monocytes Relative: 10 %
Neutro Abs: 2.8 K/uL (ref 1.7–7.7)
Neutrophils Relative %: 48 %
Platelets: 276 K/uL (ref 150–400)
RBC: 6.22 MIL/uL — ABNORMAL HIGH (ref 4.22–5.81)
RDW: 13.5 % (ref 11.5–15.5)
WBC: 5.7 K/uL (ref 4.0–10.5)
nRBC: 0 % (ref 0.0–0.2)

## 2024-10-27 LAB — BASIC METABOLIC PANEL WITH GFR
Anion gap: 11 (ref 5–15)
BUN: 13 mg/dL (ref 6–20)
CO2: 23 mmol/L (ref 22–32)
Calcium: 9.7 mg/dL (ref 8.9–10.3)
Chloride: 105 mmol/L (ref 98–111)
Creatinine, Ser: 0.99 mg/dL (ref 0.61–1.24)
GFR, Estimated: >60 mL/min (ref >60)
Glucose, Bld: 98 mg/dL (ref 70–99)
Potassium: 3.9 mmol/L (ref 3.5–5.1)
Sodium: 139 mmol/L (ref 135–145)

## 2024-10-27 LAB — TSH: TSH: 0.817 u[IU]/mL (ref 0.350–4.500)

## 2024-10-27 LAB — T4, FREE: Free T4: 0.76 ng/dL (ref 0.61–1.12)

## 2024-10-27 LAB — TROPONIN T, HIGH SENSITIVITY: Troponin T High Sensitivity: <15 ng/L (ref 0–19)

## 2024-10-27 LAB — D-DIMER, QUANTITATIVE: D-Dimer, Quant: <0.27 ug{FEU}/mL (ref 0.00–0.50)

## 2024-10-27 MED ORDER — ONDANSETRON HCL 4 MG/2ML IJ SOLN
4.0000 mg | Freq: Once | INTRAMUSCULAR | Status: AC
  Administered 2024-10-27: 4 mg via INTRAVENOUS
  Filled 2024-10-27: qty 2

## 2024-10-27 MED ORDER — FENTANYL CITRATE (PF) 50 MCG/ML IJ SOSY: 50.0000 ug | PREFILLED_SYRINGE | Freq: Once | INTRAMUSCULAR | Status: AC

## 2024-10-27 MED ORDER — ETOMIDATE 2 MG/ML IV SOLN
10.0000 mg | Freq: Once | INTRAVENOUS | Status: AC
  Administered 2024-10-27: 10 mg via INTRAVENOUS
  Filled 2024-10-27: qty 10

## 2024-10-27 MED ORDER — SODIUM CHLORIDE 0.9 % IV BOLUS
1000.0000 mL | Freq: Once | INTRAVENOUS | Status: AC
  Administered 2024-10-27: 1000 mL via INTRAVENOUS

## 2024-10-27 MED ORDER — FENTANYL CITRATE (PF) 50 MCG/ML IJ SOSY
PREFILLED_SYRINGE | INTRAMUSCULAR | Status: AC
  Administered 2024-10-27: 50 ug via INTRAVENOUS
  Filled 2024-10-27: qty 1

## 2024-10-27 NOTE — ED Notes (Signed)
 Pt discharged with mother and girlfriend.

## 2024-10-27 NOTE — ED Provider Notes (Addendum)
 [place] EMERGENCY DEPARTMENT AT [facility] Provider Note   CSN: [id number] Arrival date & time: 10/27/24  1603     Patient presents with: Atrial Fibrillation   James Moses is a 23 y.o. male history of hypertrophic cardiomyopathy presents today for A-fib with history of same.  Patient reports feeling chest discomfort in which he normally does whenever he goes into A-fib that began around 1250 today.  Patient takes metoprolol  daily and flecainide  when symptomatic.  Patient took both around 1300 today and has not had any relief.  Denies dizziness, nausea, vomiting, numbness, weakness, shortness of breath, any other complaints at this time.    Atrial Fibrillation Associated symptoms include chest pain.       Prior to Admission medications   Medication Sig Start Date End Date Taking? Authorizing Provider  flecainide  (TAMBOCOR ) 150 MG tablet Take 2 tablets (300 mg total) by mouth daily as needed. 01/06/24   Kennyth Chew, MD  ibuprofen  (ADVIL ) 800 MG tablet Take 1 tablet (800 mg total) by mouth 3 (three) times daily. Patient taking differently: Take 800 mg by mouth as needed. 09/09/20   Wieters, Hallie C, PA-C  metoprolol  succinate (TOPROL  XL) 25 MG 24 hr tablet Take 1 tablet (25 mg total) by mouth as needed. Take 1 tablet as needed with flecainide  01/06/24   Kennyth Chew, MD    Allergies: Patient has no known allergies.    Review of Systems  Cardiovascular:  Positive for chest pain.    Updated Vital Signs BP 102/80   Pulse (!) 145   Temp 98.1 F (36.7 C) (Oral)   Resp 16   SpO2 100%   Physical Exam Vitals and nursing note reviewed.  Constitutional:      General: He is not in acute distress.    Appearance: Normal appearance. He is well-developed. He is not toxic-appearing.  HENT:     Head: Normocephalic and atraumatic.     Right Ear: External ear normal.     Left Ear: External ear normal.     Mouth/Throat:     Mouth: Mucous membranes are moist.      Pharynx: Oropharynx is clear.  Eyes:     Conjunctiva/sclera: Conjunctivae normal.  Cardiovascular:     Rate and Rhythm: Normal rate. Rhythm irregular.     Heart sounds: Normal heart sounds.     Comments: Regularly irregular rhythm Pulmonary:     Effort: Pulmonary effort is normal. No respiratory distress.     Breath sounds: Normal breath sounds.  Abdominal:     General: There is no distension.     Palpations: Abdomen is soft.     Tenderness: There is no abdominal tenderness.  Musculoskeletal:        General: No swelling.     Cervical back: Neck supple.  Skin:    General: Skin is warm and dry.     Capillary Refill: Capillary refill takes less than 2 seconds.  Neurological:     General: No focal deficit present.     Mental Status: He is alert and oriented to person, place, and time.  Psychiatric:        Mood and Affect: Mood normal.     (all labs ordered are listed, but only abnormal results are displayed) Labs Reviewed  CBC WITH DIFFERENTIAL/PLATELET - Abnormal; Notable for the following components:      Result Value   RBC 6.22 (*)    MCV 77.5 (*)    MCH 25.4 (*)  All other components within normal limits  BASIC METABOLIC PANEL WITH GFR  MAGNESIUM  TSH  D-DIMER, QUANTITATIVE  T4, FREE  TROPONIN T, HIGH SENSITIVITY    EKG: EKG Interpretation Date/Time:  Saturday October 27 2024 16:09:57 EDT Ventricular Rate:  108 PR Interval:    QRS Duration:  104 QT Interval:  336 QTC Calculation: 450 R Axis:   123  Text Interpretation: Atrial fibrillation with rapid ventricular response Right axis deviation Abnormal ECG When compared with ECG of 02-Sep-2024 03:33, PREVIOUS ECG IS PRESENT Confirmed by Jerrol Agent ([id number]) on 10/27/2024 4:16:24 PM  Radiology: [facility] Chest Portable 1 View Result Date: 10/27/2024 CLINICAL DATA:  AFib. EXAM: PORTABLE CHEST 1 VIEW COMPARISON:  06/26/2024. FINDINGS: The heart size and mediastinal contours are within normal limits. Both lungs are  clear. No acute osseous abnormality. IMPRESSION: No active disease. Electronically Signed   By: Leita Birmingham M.D.   On: 10/27/2024 16:53     .Cardioversion  Date/Time: 10/27/2024 5:54 PM  Performed by: Francis Ileana SAILOR, PA-C Authorized by: Francis Ileana SAILOR, PA-C   Consent:    Consent obtained:  Verbal   Consent given by:  Patient   Risks discussed:  Cutaneous burn, induced arrhythmia, pain and death   Alternatives discussed:  No treatment Pre-procedure details:    Cardioversion basis:  Emergent   Rhythm:  Atrial fibrillation   Electrode placement:  Anterior-posterior Patient sedated: Yes. Refer to sedation procedure documentation for details of sedation.  Attempt one:    Cardioversion mode:  Synchronous   Waveform:  Biphasic   Shock (Joules):  120   Shock outcome:  Conversion to normal sinus rhythm Post-procedure details:    Patient status:  Alert   Patient tolerance of procedure:  Tolerated well, no immediate complications    Medications Ordered in the ED  fentaNYL  (SUBLIMAZE ) injection 50 mcg (has no administration in time range)  sodium chloride  0.9 % bolus 1,000 mL (1,000 mLs Intravenous New Bag/Given 10/27/24 1714)  ondansetron  (ZOFRAN ) injection 4 mg (4 mg Intravenous Given 10/27/24 1719)  etomidate  (AMIDATE ) injection 10 mg (10 mg Intravenous Given 10/27/24 1744)  fentaNYL  (SUBLIMAZE ) 50 MCG/ML injection (50 mcg  Given 10/27/24 1743)                                    Medical Decision Making Amount and/or Complexity of Data Reviewed Labs: ordered. Radiology: ordered.  Risk Prescription drug management.   This patient presents to the ED for concern of A-fib, this involves an extensive number of treatment options, and is a complaint that carries with it a high risk of complications and morbidity.  The differential diagnosis includes A-fib, hypothyroidism, electrolyte abnormality, PE, STEMI, NSTEMI   Co morbidities / Chronic conditions that complicate the patient  evaluation  Paroxysmal A-fib   Additional history obtained:  Additional history obtained from EMR External records from outside source obtained and reviewed including cardiology notes   Lab Tests:  I Ordered, and personally interpreted labs.  The pertinent results include: CBC unremarkable, D-dimer negative, troponin less than 15, BMP unremarkable, magnesium 1.9, TSH WNL   Imaging Studies ordered:  I ordered imaging studies including chest x-ray I independently visualized and interpreted imaging which showed no active disease I agree with the radiologist interpretation   Cardiac Monitoring: / EKG:  The patient was maintained on a cardiac monitor.  I personally viewed and interpreted the cardiac monitored which showed an  underlying rhythm of: A-fib with RVR Repeat EKG after cardioversion showed sinus rhythm.   Problem List / ED Course / Critical interventions / Medication management  I ordered medication including IVF, zofran , etomidate    I have reviewed the patients home medicines and have made adjustments as needed   Test / Admission - Considered:  Considered for admission or further workup however patient's vital signs, physical exam, labs, and imaging are reassuring.  Patient was successfully cardioverted while in the emergency department.  Patient was back to baseline and eating and drinking without difficulty prior to discharge.  Patient advised to follow-up with his cardiologist for possible further evaluation or medication adjustment.  Patient given return precautions.  I feel patient safe for discharge at this time.     Final diagnoses:  Paroxysmal atrial fibrillation [facility])    ED Discharge Orders     None          Francis Ileana SAILOR, PA-C 10/27/24 1824    Francis Ileana SAILOR, PA-C 10/27/24 1827    Jerrol Agent, MD 10/27/24 1840

## 2024-10-27 NOTE — Discharge Instructions (Signed)
 Today you were seen for atrial fibrillation.  You were successfully cardioverted while in the emergency department.  Please follow-up with your cardiologist for further evaluation workup.  Please return to the ED if your symptoms return.  Thank you for letting us  treat you today. After reviewing your labs and imaging, I feel you are safe to go home. Please follow up with your PCP in the next several days and provide them with your records from this visit. Return to the Emergency Room if pain becomes severe or symptoms worsen.

## 2024-10-27 NOTE — ED Notes (Signed)
 Reviewed AVS/discharge instruction with patient. Time allotted for and all questions answered. Patient is agreeable for d/c and escorted to ed exit by staff.

## 2024-10-27 NOTE — Sedation Documentation (Signed)
 Unable to rate pain during procedure

## 2024-10-27 NOTE — Progress Notes (Signed)
 Respiratory Therapist at [facility]  in room number 16____ during procedure.  Suction with Yaunker at [facility] set up and ready to use.yes Ambu bag at [facility] and ready to use. yes Patient placed on ETCO2 Nasal Cannula at __2__ LPM.    Vitals at conclusion of procedure:  ETCO2 __42___ mmHg HR 83 RR 18 SPO2 100

## 2024-10-27 NOTE — ED Triage Notes (Signed)
 A fib I feel my heart rate up started around 12:50pm today  Hx PAF  Pill in pocket treatment, flecainide  and metoprolol  taken around 1pm without relieve  Some chest discomfort

## 2024-10-27 NOTE — ED Provider Notes (Signed)
  Physical Exam  BP 120/79   Pulse 68   Temp 98.2 F (36.8 C) (Oral)   Resp 20   SpO2 100%   Physical Exam  Procedures  .Sedation  Date/Time: 10/27/2024 5:48 PM  Performed by: Jerrol Agent, MD Authorized by: Jerrol Agent, MD   Consent:    Consent obtained:  Written   Consent given by:  Patient   Risks discussed:  Allergic reaction, dysrhythmia, inadequate sedation, nausea, vomiting, respiratory compromise necessitating ventilatory assistance and intubation and prolonged sedation necessitating reversal Universal protocol:    Immediately prior to procedure, a time out was called: yes     Patient identity confirmed:  Arm band and verbally with patient Indications:    Procedure performed:  Cardioversion   Procedure necessitating sedation performed by:  Different physician Pre-sedation assessment:    Time since last food or drink:  8 hrs   ASA classification: class 2 - patient with mild systemic disease     Mouth opening:  3 or more finger widths   Thyromental distance:  3 finger widths   Mallampati score:  I - soft palate, uvula, fauces, pillars visible   Neck mobility: normal     Pre-sedation assessments completed and reviewed: airway patency, cardiovascular function, hydration status, mental status, nausea/vomiting, pain level, respiratory function and temperature   A pre-sedation assessment was completed prior to the start of the procedure Immediate pre-procedure details:    Reassessment: Patient reassessed immediately prior to procedure     Reviewed: vital signs, relevant labs/tests and NPO status     Verified: bag valve mask available, emergency equipment available, intubation equipment available, IV patency confirmed, oxygen available, reversal medications available and suction available   Procedure details (see MAR for exact dosages):    Preoxygenation:  Nasal cannula   Sedation:  Etomidate    Intended level of sedation: deep   Analgesia:  Fentanyl    Intra-procedure  monitoring:  Blood pressure monitoring, cardiac monitor, continuous capnometry, continuous pulse oximetry, frequent LOC assessments and frequent vital sign checks   Intra-procedure events: none     Total Provider sedation time (minutes):  10 Post-procedure details:   A post-sedation assessment was completed following the completion of the procedure.   Attendance: Constant attendance by certified staff until patient recovered     Recovery: Patient returned to pre-procedure baseline     Post-sedation assessments completed and reviewed: airway patency, cardiovascular function, hydration status, mental status, nausea/vomiting, pain level, respiratory function and temperature     Patient is stable for discharge or admission: yes     Procedure completion:  Tolerated well, no immediate complications           Jerrol Agent, MD 10/27/24 1749

## 2024-11-02 NOTE — ED Notes (Signed)
 Respiratory Therapist at [facility]  in room number 16____ during procedure.  Suction with Yaunker at [facility] set up and ready to use.yes Ambu bag at [facility] and ready to use. yes Patient placed on ETCO2 Nasal Cannula at __2__ LPM.    Vitals at conclusion of procedure:  ETCO2 __42___ mmHg HR 83 RR 18 SPO2 100

## 2025-02-15 ENCOUNTER — Ambulatory Visit: Payer: Self-pay | Admitting: Pulmonary Disease
